# Patient Record
Sex: Female | Born: 1965 | Race: White | Hispanic: No | Marital: Married | Smoking: Never smoker
Health system: Southern US, Community
[De-identification: ages and names within clinical notes are randomized; demographics above are authoritative.]

## PROBLEM LIST (undated history)

## (undated) DIAGNOSIS — T4145XA Adverse effect of unspecified anesthetic, initial encounter: Secondary | ICD-10-CM

## (undated) DIAGNOSIS — Z8489 Family history of other specified conditions: Secondary | ICD-10-CM

## (undated) DIAGNOSIS — Z9889 Other specified postprocedural states: Secondary | ICD-10-CM

## (undated) DIAGNOSIS — K8012 Calculus of gallbladder with acute and chronic cholecystitis without obstruction: Secondary | ICD-10-CM

## (undated) DIAGNOSIS — M1990 Unspecified osteoarthritis, unspecified site: Secondary | ICD-10-CM

## (undated) DIAGNOSIS — K219 Gastro-esophageal reflux disease without esophagitis: Secondary | ICD-10-CM

## (undated) DIAGNOSIS — T8859XA Other complications of anesthesia, initial encounter: Secondary | ICD-10-CM

## (undated) DIAGNOSIS — R112 Nausea with vomiting, unspecified: Secondary | ICD-10-CM

## (undated) HISTORY — PX: ESOPHAGOGASTRODUODENOSCOPY: SHX1529

## (undated) HISTORY — PX: PATELLAR TENDON REPAIR: SHX737

## (undated) HISTORY — PX: ENDOMETRIAL ABLATION: SHX621

## (undated) HISTORY — PX: ABDOMINAL HYSTERECTOMY: SHX81

## (undated) HISTORY — PX: COLONOSCOPY: SHX174

## (undated) HISTORY — PX: TUBAL LIGATION: SHX77

## (undated) HISTORY — DX: Calculus of gallbladder with acute and chronic cholecystitis without obstruction: K80.12

## (undated) HISTORY — PX: ORIF PATELLA: SHX5033

---

## 2006-06-03 ENCOUNTER — Inpatient Hospital Stay: Payer: Self-pay | Admitting: Orthopaedic Surgery

## 2006-09-09 ENCOUNTER — Encounter: Payer: Self-pay | Admitting: Orthopaedic Surgery

## 2006-09-11 ENCOUNTER — Encounter: Payer: Self-pay | Admitting: Orthopaedic Surgery

## 2006-10-12 ENCOUNTER — Encounter: Payer: Self-pay | Admitting: Orthopaedic Surgery

## 2006-11-11 ENCOUNTER — Encounter: Payer: Self-pay | Admitting: Orthopaedic Surgery

## 2007-03-30 ENCOUNTER — Ambulatory Visit: Payer: Self-pay | Admitting: Obstetrics and Gynecology

## 2007-08-30 ENCOUNTER — Ambulatory Visit: Payer: Self-pay | Admitting: Internal Medicine

## 2009-03-16 ENCOUNTER — Ambulatory Visit: Payer: Self-pay | Admitting: Obstetrics and Gynecology

## 2009-03-28 ENCOUNTER — Ambulatory Visit: Payer: Self-pay | Admitting: Obstetrics and Gynecology

## 2009-04-25 ENCOUNTER — Ambulatory Visit: Payer: Self-pay | Admitting: Surgery

## 2009-04-25 HISTORY — PX: BREAST CYST ASPIRATION: SHX578

## 2009-09-26 ENCOUNTER — Ambulatory Visit: Payer: Self-pay | Admitting: Surgery

## 2010-03-20 ENCOUNTER — Ambulatory Visit: Payer: Self-pay | Admitting: Obstetrics and Gynecology

## 2011-03-26 ENCOUNTER — Ambulatory Visit: Payer: Self-pay | Admitting: Obstetrics and Gynecology

## 2011-04-08 ENCOUNTER — Ambulatory Visit: Payer: Self-pay | Admitting: Obstetrics and Gynecology

## 2012-04-06 ENCOUNTER — Ambulatory Visit: Payer: Self-pay | Admitting: Obstetrics and Gynecology

## 2013-01-27 ENCOUNTER — Ambulatory Visit: Payer: Self-pay | Admitting: Family Medicine

## 2013-01-29 ENCOUNTER — Ambulatory Visit: Payer: Self-pay | Admitting: Internal Medicine

## 2013-04-13 ENCOUNTER — Ambulatory Visit: Payer: Self-pay | Admitting: Obstetrics and Gynecology

## 2013-06-21 ENCOUNTER — Ambulatory Visit: Payer: Self-pay | Admitting: Gastroenterology

## 2013-06-23 LAB — PATHOLOGY REPORT

## 2014-06-01 ENCOUNTER — Ambulatory Visit: Payer: Self-pay | Admitting: Obstetrics and Gynecology

## 2014-11-30 ENCOUNTER — Encounter: Payer: Self-pay | Admitting: *Deleted

## 2014-11-30 ENCOUNTER — Inpatient Hospital Stay: Admission: RE | Admit: 2014-11-30 | Payer: Self-pay | Source: Ambulatory Visit

## 2014-11-30 NOTE — Patient Instructions (Signed)
  Your procedure is scheduled on: 12-08-14  Report to [place] To find out your arrival time please call [phone] between 1PM - 3PM on 12-07-14  Remember: Instructions that are not followed completely may result in serious medical risk, up to and including death, or upon the discretion of your surgeon and anesthesiologist your surgery may need to be rescheduled.    __X__ 1. Do not eat food or drink liquids after midnight. No gum chewing or hard candies.     __X__ 2. No Alcohol for 24 hours before or after surgery.   ____ 3. Bring all medications with you on the day of surgery if instructed.    ____ 4. Notify your doctor if there is any change in your medical condition     (cold, fever, infections).     Do not wear jewelry, make-up, hairpins, clips or nail polish.  Do not wear lotions, powders, or perfumes. You may wear deodorant.  Do not shave 48 hours prior to surgery. Men may shave face and neck.  Do not bring valuables to the hospital.    [facility] is not responsible for any belongings or valuables.               Contacts, dentures or bridgework may not be worn into surgery.  Leave your suitcase in the car. After surgery it may be brought to your room.  For patients admitted to the hospital, discharge time is determined by your treatment team.   Patients discharged the day of surgery will not be allowed to drive home.   Please read over the following fact sheets that you were given:      _X___ Take these medicines the morning of surgery with A SIP OF WATER:    1. PRILOSEC  2. TAKE AN EXTRA DOSE OF PRILOSEC Wednesday NIGHT  3.   4.  5.  6.  ____ Fleet Enema (as directed)   ____ Use CHG Soap as directed  ____ Use inhalers on the day of surgery  ____ Stop metformin 2 days prior to surgery    ____ Take 1/2 of usual insulin dose the night before surgery and none on the morning of surgery.   ____ Stop Coumadin/Plavix/aspirin  -N/A  ____ Stop Anti-inflammatories-NO NSAIDS OR ASA PRODUCTS-TYLENOL OK   ____ Stop supplements until after surgery.    ____ Bring C-Pap to the hospital.

## 2014-12-08 ENCOUNTER — Encounter
Admission: RE | Disposition: A | Discharge status: Home / Self Care | Payer: Self-pay | Source: Ambulatory Visit | Attending: Surgery

## 2014-12-08 ENCOUNTER — Encounter: Payer: Self-pay | Admitting: *Deleted

## 2014-12-08 ENCOUNTER — Ambulatory Visit: Payer: BLUE CROSS/BLUE SHIELD | Admitting: Anesthesiology

## 2014-12-08 ENCOUNTER — Ambulatory Visit
Admission: RE | Admit: 2014-12-08 | Discharge: 2014-12-08 | Disposition: A | Discharge status: Home / Self Care | Payer: BLUE CROSS/BLUE SHIELD | Source: Ambulatory Visit | Attending: Surgery | Admitting: Surgery

## 2014-12-08 DIAGNOSIS — K219 Gastro-esophageal reflux disease without esophagitis: Secondary | ICD-10-CM | POA: Diagnosis not present

## 2014-12-08 DIAGNOSIS — K648 Other hemorrhoids: Secondary | ICD-10-CM | POA: Insufficient documentation

## 2014-12-08 DIAGNOSIS — K644 Residual hemorrhoidal skin tags: Secondary | ICD-10-CM | POA: Insufficient documentation

## 2014-12-08 DIAGNOSIS — Z880 Allergy status to penicillin: Secondary | ICD-10-CM | POA: Diagnosis not present

## 2014-12-08 HISTORY — DX: Family history of other specified conditions: Z84.89

## 2014-12-08 HISTORY — DX: Gastro-esophageal reflux disease without esophagitis: K21.9

## 2014-12-08 HISTORY — PX: HEMORRHOID SURGERY: SHX153

## 2014-12-08 LAB — POCT PREGNANCY, URINE: PREG TEST UR: NEGATIVE

## 2014-12-08 SURGERY — HEMORRHOIDECTOMY
Anesthesia: General

## 2014-12-08 MED ORDER — FENTANYL CITRATE (PF) 100 MCG/2ML IJ SOLN
INTRAMUSCULAR | Status: DC | PRN
  Administered 2014-12-08: 100 ug via INTRAVENOUS

## 2014-12-08 MED ORDER — FENTANYL CITRATE (PF) 100 MCG/2ML IJ SOLN
INTRAMUSCULAR | Status: AC
  Filled 2014-12-08: qty 2

## 2014-12-08 MED ORDER — ONDANSETRON HCL 4 MG/2ML IJ SOLN
4.0000 mg | Freq: Once | INTRAMUSCULAR | Status: AC | PRN
  Administered 2014-12-08: 4 mg via INTRAVENOUS

## 2014-12-08 MED ORDER — OXYCODONE-ACETAMINOPHEN 5-325 MG PO TABS: 1.0000 | ORAL_TABLET | ORAL | Status: DC | PRN

## 2014-12-08 MED ORDER — GLYCOPYRROLATE 0.2 MG/ML IJ SOLN
INTRAMUSCULAR | Status: DC | PRN
  Administered 2014-12-08: 0.2 mg via INTRAVENOUS

## 2014-12-08 MED ORDER — BUPIVACAINE-EPINEPHRINE (PF) 0.5% -1:200000 IJ SOLN
INTRAMUSCULAR | Status: AC
  Filled 2014-12-08: qty 30

## 2014-12-08 MED ORDER — BUPIVACAINE LIPOSOME 1.3 % IJ SUSP
INTRAMUSCULAR | Status: AC
  Filled 2014-12-08: qty 20

## 2014-12-08 MED ORDER — METOPROLOL TARTRATE 1 MG/ML IV SOLN
INTRAVENOUS | Status: DC | PRN
  Administered 2014-12-08 (×2): 1 mg via INTRAVENOUS
  Administered 2014-12-08: 3 mg via INTRAVENOUS

## 2014-12-08 MED ORDER — PROMETHAZINE HCL 25 MG/ML IJ SOLN
INTRAMUSCULAR | Status: AC
  Filled 2014-12-08: qty 1

## 2014-12-08 MED ORDER — FENTANYL CITRATE (PF) 100 MCG/2ML IJ SOLN
25.0000 ug | INTRAMUSCULAR | Status: DC | PRN
  Administered 2014-12-08 (×4): 25 ug via INTRAVENOUS

## 2014-12-08 MED ORDER — MIDAZOLAM HCL 2 MG/2ML IJ SOLN
INTRAMUSCULAR | Status: DC | PRN
  Administered 2014-12-08: 2 mg via INTRAVENOUS

## 2014-12-08 MED ORDER — BUPIVACAINE LIPOSOME 1.3 % IJ SUSP
INTRAMUSCULAR | Status: DC | PRN
  Administered 2014-12-08: 20 mL

## 2014-12-08 MED ORDER — PROPOFOL 10 MG/ML IV BOLUS
INTRAVENOUS | Status: DC | PRN
  Administered 2014-12-08: 200 mg via INTRAVENOUS

## 2014-12-08 MED ORDER — LACTATED RINGERS IV SOLN
INTRAVENOUS | Status: DC
  Administered 2014-12-08: 12:00:00 via INTRAVENOUS

## 2014-12-08 MED ORDER — BUPIVACAINE HCL (PF) 0.5 % IJ SOLN
INTRAMUSCULAR | Status: AC
  Filled 2014-12-08: qty 30

## 2014-12-08 SURGICAL SUPPLY — 30 items
BLADE SURG 15 STRL LF DISP TIS (BLADE) ×1 IMPLANT
BLADE SURG 15 STRL SS (BLADE) ×2
CANISTER SUCT 1200ML W/VALVE (MISCELLANEOUS) ×3 IMPLANT
DRAPE LAPAROTOMY 100X77 ABD (DRAPES) ×3 IMPLANT
DRAPE LEGGINS SURG 28X43 STRL (DRAPES) ×3 IMPLANT
DRAPE UNDER BUTTOCK W/FLU (DRAPES) ×3 IMPLANT
GAUZE SPONGE 4X4 12PLY STRL (GAUZE/BANDAGES/DRESSINGS) ×3 IMPLANT
GLOVE BIO SURGEON STRL SZ7.5 (GLOVE) ×15 IMPLANT
GOWN STRL REUS W/ TWL LRG LVL3 (GOWN DISPOSABLE) ×3 IMPLANT
GOWN STRL REUS W/TWL LRG LVL3 (GOWN DISPOSABLE) ×6
HARMONIC SCALPEL FOCUS (MISCELLANEOUS) IMPLANT
JELLY LUB 2OZ STRL (MISCELLANEOUS) ×2
JELLY LUBE 2OZ STRL (MISCELLANEOUS) ×1 IMPLANT
LABEL OR SOLS (LABEL) ×3 IMPLANT
NDL SAFETY 25GX1.5 (NEEDLE) ×3 IMPLANT
NS IRRIG 500ML POUR BTL (IV SOLUTION) ×3 IMPLANT
PACK BASIN MINOR ARMC (MISCELLANEOUS) ×3 IMPLANT
PAD ABD DERMACEA PRESS 5X9 (GAUZE/BANDAGES/DRESSINGS) IMPLANT
PAD GROUND ADULT SPLIT (MISCELLANEOUS) ×3 IMPLANT
PAD PREP 24X41 OB/GYN DISP (PERSONAL CARE ITEMS) ×3 IMPLANT
SOL PREP PVP 2OZ (MISCELLANEOUS) ×3
SOLUTION PREP PVP 2OZ (MISCELLANEOUS) ×1 IMPLANT
STAPLER PROXIMATE HCS (STAPLE) IMPLANT
STRAP SAFETY BODY (MISCELLANEOUS) ×3 IMPLANT
SUT CHROMIC 0 SH (SUTURE) ×3 IMPLANT
SUT CHROMIC 2 0 SH (SUTURE) ×3 IMPLANT
SUT CHROMIC 3 0 SH 27 (SUTURE) ×3 IMPLANT
SUT CHROMIC 4 0 RB 1X27 (SUTURE) ×3 IMPLANT
SUT PROLENE 3 0 PS 2 (SUTURE) IMPLANT
SYRINGE 10CC LL (SYRINGE) ×3 IMPLANT

## 2014-12-08 NOTE — Transfer of Care (Signed)
Immediate Anesthesia Transfer of Care Note  Patient: Monique Brock  Procedure(s) Performed: Procedure(s): HEMORRHOIDECTOMY (N/A)  Patient Location: PACU  Anesthesia Type:General  Level of Consciousness: awake  Airway & Oxygen Therapy: Patient Spontanous Breathing  Post-op Assessment: Report given to RN  Post vital signs: stable  Last Vitals:  Filed Vitals:   12/08/14 1204  BP: 139/83  Pulse: 70  Temp: 37 C  Resp: 14    Complications: No apparent anesthesia complications
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
## 2014-12-08 NOTE — Anesthesia Postprocedure Evaluation (Signed)
  Anesthesia Post-op Note  Patient: Monique Brock  Procedure(s) Performed: Procedure(s): HEMORRHOIDECTOMY (N/A)  Anesthesia type:General  Patient location: PACU  Post pain: Pain level controlled  Post assessment: Post-op Vital signs reviewed, Patient's Cardiovascular Status Stable, Respiratory Function Stable, Patent Airway and No signs of Nausea or vomiting  Post vital signs: Reviewed and stable  Last Vitals:  Filed Vitals:   12/08/14 1558  BP: 112/70  Pulse: 77  Temp:   Resp: 16    Level of consciousness: awake, alert  and patient cooperative  Complications: No apparent anesthesia complications

## 2014-12-08 NOTE — Op Note (Signed)
OPERATIVE REPORT  PREOPERATIVE  DIAGNOSIS: Hemorrhoids.  POSTOPERATIVE DIAGNOSIS: . Hemorrhoids  PROCEDURE: . Hemorrhoidectomy  ANESTHESIA:  General  SURGEON: Rochel Brome  MD   INDICATIONS: . She reports a history of pain and bleeding. She had physical exam and anoscopic findings of internal and external hemorrhoids. Surgery was recommended for definitive treatment  With the patient on the operating table in the supine position she was placed under general anesthesia. The legs were elevated into the lithotomy position using ankle straps. The anal area was prepared with Betadine solution and draped with sterile towels and sheets. Initial inspection revealed that there was an external hemorrhoid which was posterior and also an external hemorrhoid which extended anterior from 10:00 to 12:00 position. Skin and subcutaneous tissues were infiltrated with Exparel and also  infiltrated around the sphincter The anal canal was dilated large enough to admit 2 fingers. The bivalve anal retractor was introduced. There was a large internal hemorrhoid which is anterior and also a large internal hemorrhoid which is at the 3:00 position. The hemorrhoid which was anterior was grasped with Kelly clamp and elevated a 0 chromic suture ligature was placed in the hemorrhoid was excised. There was some bleeding subsequently and used a 0 chromic suture ligature for hemostasis. A hemorrhoid at the 3:00 position was excised placing a 3-0 chromic suture ligature at the upper end of the internal hemorrhoid.  The hemorrhoid was excised using electrocautery for hemostasis the wound was repaired with running 3-0 chromic locked stitches.  The external hemorrhoid which extended from 10:00 to 12:00 was excised with a elliptical excision extending from 10:00 to 12:00 the wound was inspected and numerous small bleeding points are cauterized the wound was repaired with interrupted 4-0 chromic simple sutures leaving an opening for  drainage. Another external hemorrhoid at the 6:00 position was excised with a V-shaped incision. The wound was inspected and several small bleeding points were cauterized. The wound was repaired with interrupted 4-0 chromic sutures. Specimens were submitted for routine pathology. The wound was further prepared with Betadine solution and infiltrated subcutaneous tissues with Exparel and also infiltrated tissues surrounding the sphincter dressings were applied with paper tape  The patient appear to be in satisfactory condition and was prepared for transfer to the recovery room  [place].D.

## 2014-12-08 NOTE — Anesthesia Procedure Notes (Signed)
Procedure Name: LMA Insertion Date/Time: 12/08/2014 1:03 PM Performed by: Leander Rams Pre-anesthesia Checklist: Patient identified, Emergency Drugs available, Suction available, Patient being monitored and Timeout performed Patient Re-evaluated:Patient Re-evaluated prior to inductionOxygen Delivery Method: Circle system utilized Preoxygenation: Pre-oxygenation with 100% oxygen Intubation Type: IV induction LMA: LMA inserted LMA Size: 4.0

## 2014-12-08 NOTE — Progress Notes (Signed)
Neg preg test

## 2014-12-08 NOTE — H&P (Signed)
  She reports no change in condition since office visit.   Reviewed records and discussed plan for surgery

## 2014-12-08 NOTE — Discharge Instructions (Signed)
Take Tylenol or Percocet if needed for pain. Remove dressing tomorrow. May shower. May sitting in warm water. Tuck gauze or pad in underwear as needed for drainage. Continue MiraLAX 1 dose each day.AMBULATORY SURGERY  DISCHARGE INSTRUCTIONS   1) The drugs that you were given will stay in your system until tomorrow so for the next 24 hours you should not:  A) Drive an automobile B) Make any legal decisions C) Drink any alcoholic beverage   2) You may resume regular meals tomorrow.  Today it is better to start with liquids and gradually work up to solid foods.  You may eat anything you prefer, but it is better to start with liquids, then soup and crackers, and gradually work up to solid foods.   3) Please notify your doctor immediately if you have any unusual bleeding, trouble breathing, redness and pain at the surgery site, drainage, fever, or pain not relieved by medication.    4) Additional Instructions:        Please contact your physician with any problems or Same Day Surgery at [phone], Monday through Friday 6 am to 4 pm, or [place] at [facility] number at [phone].

## 2014-12-09 ENCOUNTER — Encounter: Payer: Self-pay | Admitting: Surgery

## 2014-12-12 LAB — SURGICAL PATHOLOGY

## 2015-05-29 ENCOUNTER — Other Ambulatory Visit: Payer: Self-pay | Admitting: Obstetrics and Gynecology

## 2015-05-29 DIAGNOSIS — Z1231 Encounter for screening mammogram for malignant neoplasm of breast: Secondary | ICD-10-CM

## 2015-06-09 ENCOUNTER — Ambulatory Visit: Payer: BLUE CROSS/BLUE SHIELD | Attending: Obstetrics and Gynecology

## 2016-06-03 ENCOUNTER — Emergency Department: Payer: Worker's Compensation

## 2016-06-03 ENCOUNTER — Encounter: Payer: Self-pay | Admitting: Emergency Medicine

## 2016-06-03 ENCOUNTER — Emergency Department
Admission: EM | Admit: 2016-06-03 | Discharge: 2016-06-03 | Disposition: A | Discharge status: Home / Self Care | Payer: Worker's Compensation | Attending: Emergency Medicine | Admitting: Emergency Medicine

## 2016-06-03 DIAGNOSIS — Y9389 Activity, other specified: Secondary | ICD-10-CM | POA: Diagnosis not present

## 2016-06-03 DIAGNOSIS — Y929 Unspecified place or not applicable: Secondary | ICD-10-CM | POA: Diagnosis not present

## 2016-06-03 DIAGNOSIS — W010XXA Fall on same level from slipping, tripping and stumbling without subsequent striking against object, initial encounter: Secondary | ICD-10-CM | POA: Diagnosis not present

## 2016-06-03 DIAGNOSIS — Y999 Unspecified external cause status: Secondary | ICD-10-CM | POA: Diagnosis not present

## 2016-06-03 DIAGNOSIS — S8391XA Sprain of unspecified site of right knee, initial encounter: Secondary | ICD-10-CM | POA: Insufficient documentation

## 2016-06-03 DIAGNOSIS — S8991XA Unspecified injury of right lower leg, initial encounter: Secondary | ICD-10-CM | POA: Diagnosis present

## 2016-06-03 MED ORDER — MELOXICAM 7.5 MG PO TABS: 7.5000 mg | ORAL_TABLET | Freq: Every day | ORAL | 0 refills | Status: DC

## 2016-06-03 MED ORDER — LIDOCAINE 5 % EX PTCH
1.0000 | MEDICATED_PATCH | CUTANEOUS | Status: DC
  Administered 2016-06-03: 1 via TRANSDERMAL
  Filled 2016-06-03: qty 1

## 2016-06-03 MED ORDER — LIDOCAINE 5 % EX PTCH: 1.0000 | MEDICATED_PATCH | CUTANEOUS | 0 refills | Status: DC

## 2016-06-03 NOTE — ED Notes (Signed)
R knee immobilizer fitted and applied, fitted for crutches and gait training done.

## 2016-06-03 NOTE — ED Notes (Signed)
See triage note  States she slipped on ice this am going to work  Having pain to right knee and lower leg  No deformity noted  Positive pulses

## 2016-06-03 NOTE — ED Triage Notes (Signed)
Pt brought to ed via ems with reports of right knee pain after falling on the ice.

## 2016-06-03 NOTE — ED Notes (Signed)
Drug screen done and walked to lab.
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
## 2016-06-11 ENCOUNTER — Ambulatory Visit
Admission: RE | Admit: 2016-06-11 | Discharge: 2016-06-11 | Disposition: A | Discharge status: Home / Self Care | Payer: 59 | Source: Ambulatory Visit | Attending: Obstetrics and Gynecology | Admitting: Obstetrics and Gynecology

## 2016-06-11 DIAGNOSIS — Z1231 Encounter for screening mammogram for malignant neoplasm of breast: Secondary | ICD-10-CM | POA: Insufficient documentation

## 2017-03-05 ENCOUNTER — Other Ambulatory Visit: Payer: Self-pay

## 2017-03-10 NOTE — H&P (Signed)
[name] is a 51 y.o. female here for AUB. Pt scheduled for [facility] and bilateral salpingectomy  .pt with a h/o AUB . 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 She is s/p an ablation and BTL in 2015  SVDx1 + LTCS x1.  No dyspareunia   Past Medical History:  has a past medical history of History of fracture.  Past Surgical History:  has a past surgical history that includes Colonoscopy (06/21/13); egd (06/21/13); Cesarean section; Endometrial ablation; Tubal ligation; RIGHT PATELLAR ORIF (06/04/2006); LEFT PATELLAR TENDON REPAIR (06/04/2006); and Hemorroidectomy (12/08/14). Family History: family history includes Gallbladder disease in her mother and other; High blood pressure (Hypertension) in her father and mother. Social History:  reports that she has never smoked. She has never used smokeless tobacco. She reports that she does not drink alcohol or use drugs. OB/GYN History:          OB History    Gravida Para Term Preterm AB Living   2 2 2     2    SAB TAB Ectopic Molar Multiple Live Births             2      Allergies: is allergic to amoxicillin. Medications:  Current Outpatient Prescriptions:  .  omeprazole (PRILOSEC) 20 MG DR capsule, TAKE ONE CAPSULE BY MOUTH EVERY DAY 1 HOUR BEFORE MEALS, Disp: 30 capsule, Rfl: 3  Review of Systems: General:                      No fatigue or weight loss Eyes:                           No vision changes Ears:                            No hearing difficulty Respiratory:                No cough or shortness of breath Pulmonary:                  No asthma or shortness of breath Cardiovascular:           No chest pain, palpitations, dyspnea on exertion Gastrointestinal:          No abdominal bloating, chronic diarrhea, constipations, masses, pain or hematochezia Genitourinary:             No hematuria, dysuria, abnormal vaginal discharge, pelvic pain, Menometrorrhagia Lymphatic:                    No swollen lymph nodes Musculoskeletal:         No muscle weakness Neurologic:                  No extremity weakness, syncope, seizure disorder Psychiatric:                  No history of depression, delusions or suicidal/homicidal ideation    Exam:      Vitals:   03/11/2017  1458  BP: (!) 127/93  Pulse: 89    Body mass index is 31.18 kg/m.  WDWN white/ female in NAD   Lungs: CTA  CV : RRR without murmur   Breast: exam done in sitting and lying position : No dimpling or retraction, no dominant mass, no spontaneous discharge,  no axillary adenopathy Neck:  no thyromegaly Abdomen: soft , no mass, normal active bowel sounds,  non-tender, no rebound tenderness Pelvic: tanner stage 5 ,  External genitalia: vulva /labia no lesions Urethra: no prolapse Vagina: normal physiologic d/c, adequate room for TVH if need be  Cervix: no lesions, no cervical motion tenderness   Uterus: normal size shape and contour, non-tender Adnexa: no mass,  non-tender   Rectovaginal: no mass heme negative  u/s : complex left ovarian cyst 1.7 cm , o/w wnl    Impression:   The primary encounter diagnosis was Abnormal uterine bleeding (AUB), unspecified. A diagnosis of Screening for cervical cancer was also pertinent to this visit.    Plan:    I have spoken with the patient regarding treatment options including expectant management, hormonal options, or surgical intervention. After a full discussion pt will proceed with [facility] and bilateral salpingectomy        Risks of the procedure discussed with the pt .  She is aware of the potential for upstaging an occult cancer with morcellator use    Talyssa Gibas Samuel [place], MD

## 2017-03-11 ENCOUNTER — Inpatient Hospital Stay: Admission: RE | Admit: 2017-03-11 | Payer: Self-pay | Source: Ambulatory Visit

## 2017-03-14 ENCOUNTER — Encounter
Admission: RE | Admit: 2017-03-14 | Discharge: 2017-03-14 | Disposition: A | Discharge status: Home / Self Care | Payer: BLUE CROSS/BLUE SHIELD | Source: Ambulatory Visit | Attending: Obstetrics and Gynecology | Admitting: Obstetrics and Gynecology

## 2017-03-14 HISTORY — DX: Unspecified osteoarthritis, unspecified site: M19.90

## 2017-03-14 NOTE — Patient Instructions (Addendum)
Your procedure is scheduled on: 03-21-17 FRIDAY Report to Same Day Surgery 2nd floor medical mall [facility] Entrance-take elevator on left to 2nd floor.  Check in with surgery information desk.) To find out your arrival time please call [phone] between 1PM - 3PM on 03-20-17 THURSDAY  Remember: Instructions that are not followed completely may result in serious medical risk, up to and including death, or upon the discretion of your surgeon and anesthesiologist your surgery may need to be rescheduled.    _x___ 1. Do not eat food after midnight the night before your procedure. NO GUM CHEWING OR CANDY AFTER MIDNIGHT.  You may drink clear liquids up to 2 hours before you are scheduled to arrive at the hospital for your procedure.  Do not drink clear liquids within 2 hours of your scheduled arrival to the hospital.  Clear liquids include  --Water or Apple juice without pulp  --Clear carbohydrate beverage such as ClearFast or Gatorade  --Black Coffee or Clear Tea (No milk, no creamers, do not add anything to  the coffee or Tea)      __x__ 2. No Alcohol for 24 hours before or after surgery.   __x__3. No Smoking for 24 prior to surgery.   ____  4. Bring all medications with you on the day of surgery if instructed.    __x__ 5. Notify your doctor if there is any change in your medical condition     (cold, fever, infections).     Do not wear jewelry, make-up, hairpins, clips or nail polish.  Do not wear lotions, powders, or perfumes. You may wear deodorant.  Do not shave 48 hours prior to surgery. Men may shave face and neck.  Do not bring valuables to the hospital.    [facility] is not responsible for any belongings or valuables.               Contacts, dentures or bridgework may not be worn into surgery.  Leave your suitcase in the car. After surgery it may be brought to your room.  For patients admitted to the hospital, discharge time is determined by your treatment  team.   Patients discharged the day of surgery will not be allowed to drive home.  You will need someone to drive you home and stay with you the night of your procedure.    Please read over the following fact sheets that you were given:   [facility] Preparing for Surgery and or MRSA Information   ____ TAKE THE FOLLOWING MEDICATION THE MORNING OF SURGERY WITH A SMALL SIP OF WATER. These include:  1. PRILOSEC  2. TAKE AN EXTRA PRILOSEC ON Thursday NIGHT BEFORE BED  3.  4.  5.  6.  _X___Fleets enema or Magnesium Citrate as directed-DO FLEETS ENEMA AT HOME 1 HOUR PRIOR TO [place]   _x___ Use CHG Soap or sage wipes as directed on instruction sheet   ____ Use inhalers on the day of surgery and bring to hospital day of surgery  ____ Stop Metformin and Janumet 2 days prior to surgery.    ____ Take 1/2 of usual insulin dose the night before surgery and none on the morning surgery.   ____ Follow recommendations from Cardiologist, Pulmonologist or PCP regarding stopping Aspirin, Coumadin, Plavix ,Eliquis, Effient, or Pradaxa, and Pletal.  X____Stop Anti-inflammatories such as Advil, ALEVE, Ibuprofen, Motrin, Naproxen, Naprosyn, Goodies powders or aspirin products NOW-OK to take Tylenol   ____ Stop supplements until after surgery.  ____ Bring C-Pap to the hospital.

## 2017-03-19 ENCOUNTER — Encounter
Admission: RE | Admit: 2017-03-19 | Discharge: 2017-03-19 | Disposition: A | Discharge status: Home / Self Care | Payer: BLUE CROSS/BLUE SHIELD | Source: Ambulatory Visit | Attending: Obstetrics and Gynecology | Admitting: Obstetrics and Gynecology

## 2017-03-19 DIAGNOSIS — K219 Gastro-esophageal reflux disease without esophagitis: Secondary | ICD-10-CM | POA: Diagnosis not present

## 2017-03-19 DIAGNOSIS — Z79899 Other long term (current) drug therapy: Secondary | ICD-10-CM | POA: Diagnosis not present

## 2017-03-19 DIAGNOSIS — N920 Excessive and frequent menstruation with regular cycle: Secondary | ICD-10-CM | POA: Diagnosis present

## 2017-03-19 DIAGNOSIS — N808 Other endometriosis: Secondary | ICD-10-CM | POA: Diagnosis not present

## 2017-03-19 DIAGNOSIS — Z880 Allergy status to penicillin: Secondary | ICD-10-CM | POA: Diagnosis not present

## 2017-03-19 DIAGNOSIS — Z6830 Body mass index (BMI) 30.0-30.9, adult: Secondary | ICD-10-CM | POA: Diagnosis not present

## 2017-03-19 DIAGNOSIS — E669 Obesity, unspecified: Secondary | ICD-10-CM | POA: Diagnosis not present

## 2017-03-19 LAB — BASIC METABOLIC PANEL
Anion gap: 8 (ref 5–15)
BUN: 9 mg/dL (ref 6–20)
CHLORIDE: 105 mmol/L (ref 101–111)
CO2: 25 mmol/L (ref 22–32)
CREATININE: 0.73 mg/dL (ref 0.44–1.00)
Calcium: 9.4 mg/dL (ref 8.9–10.3)
GFR calc non Af Amer: >60 mL/min (ref >60)
GLUCOSE: 94 mg/dL (ref 65–99)
Potassium: 3.8 mmol/L (ref 3.5–5.1)
Sodium: 138 mmol/L (ref 135–145)

## 2017-03-19 LAB — TYPE AND SCREEN
ABO/RH(D): A POS
Antibody Screen: NEGATIVE

## 2017-03-19 LAB — CBC
HCT: 38.9 % (ref 35.0–47.0)
HEMOGLOBIN: 13.4 g/dL (ref 12.0–16.0)
MCH: 30.7 pg (ref 26.0–34.0)
MCHC: 34.4 g/dL (ref 32.0–36.0)
MCV: 89.1 fL (ref 80.0–100.0)
PLATELETS: 233 10*3/uL (ref 150–440)
RBC: 4.36 MIL/uL (ref 3.80–5.20)
RDW: 13.5 % (ref 11.5–14.5)
WBC: 7.4 10*3/uL (ref 3.6–11.0)

## 2017-03-20 MED ORDER — CIPROFLOXACIN IN D5W 400 MG/200ML IV SOLN
400.0000 mg | INTRAVENOUS | Status: DC
  Filled 2017-03-20: qty 200

## 2017-03-20 MED ORDER — CLINDAMYCIN PHOSPHATE 900 MG/50ML IV SOLN: 900.0000 mg | INTRAVENOUS | Status: DC

## 2017-03-21 ENCOUNTER — Ambulatory Visit: Payer: BLUE CROSS/BLUE SHIELD | Admitting: Anesthesiology

## 2017-03-21 ENCOUNTER — Encounter: Payer: Self-pay | Admitting: *Deleted

## 2017-03-21 ENCOUNTER — Observation Stay
Admission: RE | Admit: 2017-03-21 | Discharge: 2017-03-21 | Disposition: A | Discharge status: Home / Self Care | Payer: BLUE CROSS/BLUE SHIELD | Source: Ambulatory Visit | Attending: Obstetrics and Gynecology | Admitting: Obstetrics and Gynecology

## 2017-03-21 ENCOUNTER — Encounter
Admission: RE | Disposition: A | Discharge status: Home / Self Care | Payer: Self-pay | Source: Ambulatory Visit | Attending: Obstetrics and Gynecology

## 2017-03-21 DIAGNOSIS — K219 Gastro-esophageal reflux disease without esophagitis: Secondary | ICD-10-CM | POA: Insufficient documentation

## 2017-03-21 DIAGNOSIS — N808 Other endometriosis: Secondary | ICD-10-CM | POA: Insufficient documentation

## 2017-03-21 DIAGNOSIS — N920 Excessive and frequent menstruation with regular cycle: Principal | ICD-10-CM | POA: Insufficient documentation

## 2017-03-21 DIAGNOSIS — Z880 Allergy status to penicillin: Secondary | ICD-10-CM | POA: Insufficient documentation

## 2017-03-21 DIAGNOSIS — Z79899 Other long term (current) drug therapy: Secondary | ICD-10-CM | POA: Insufficient documentation

## 2017-03-21 DIAGNOSIS — Z6830 Body mass index (BMI) 30.0-30.9, adult: Secondary | ICD-10-CM | POA: Insufficient documentation

## 2017-03-21 DIAGNOSIS — Z9889 Other specified postprocedural states: Secondary | ICD-10-CM

## 2017-03-21 DIAGNOSIS — E669 Obesity, unspecified: Secondary | ICD-10-CM | POA: Insufficient documentation

## 2017-03-21 HISTORY — PX: LAPAROSCOPIC BILATERAL SALPINGECTOMY: SHX5889

## 2017-03-21 HISTORY — PX: EXCISION OF ENDOMETRIOMA: SHX6473

## 2017-03-21 HISTORY — PX: LAPAROSCOPIC SUPRACERVICAL HYSTERECTOMY: SHX5399

## 2017-03-21 LAB — CBC
HCT: 37.2 % (ref 35.0–47.0)
HEMOGLOBIN: 12.7 g/dL (ref 12.0–16.0)
MCH: 30.6 pg (ref 26.0–34.0)
MCHC: 34.2 g/dL (ref 32.0–36.0)
MCV: 89.5 fL (ref 80.0–100.0)
Platelets: 263 10*3/uL (ref 150–440)
RBC: 4.16 MIL/uL (ref 3.80–5.20)
RDW: 13.7 % (ref 11.5–14.5)
WBC: 14.5 10*3/uL — AB (ref 3.6–11.0)

## 2017-03-21 LAB — ABO/RH: ABO/RH(D): A POS

## 2017-03-21 LAB — POCT PREGNANCY, URINE: PREG TEST UR: NEGATIVE

## 2017-03-21 SURGERY — HYSTERECTOMY, SUPRACERVICAL, LAPAROSCOPIC
Anesthesia: General

## 2017-03-21 MED ORDER — SILVER NITRATE-POT NITRATE 75-25 % EX MISC
CUTANEOUS | Status: DC | PRN
  Administered 2017-03-21: 5 via TOPICAL

## 2017-03-21 MED ORDER — ACETAMINOPHEN 10 MG/ML IV SOLN
INTRAVENOUS | Status: DC | PRN
  Administered 2017-03-21: 1000 mg via INTRAVENOUS

## 2017-03-21 MED ORDER — LACTATED RINGERS IV SOLN: INTRAVENOUS | Status: DC

## 2017-03-21 MED ORDER — CEFAZOLIN SODIUM-DEXTROSE 2-4 GM/100ML-% IV SOLN
INTRAVENOUS | Status: AC
  Filled 2017-03-21: qty 100

## 2017-03-21 MED ORDER — BUPIVACAINE HCL 0.5 % IJ SOLN
INTRAMUSCULAR | Status: DC | PRN
  Administered 2017-03-21: 18 mL

## 2017-03-21 MED ORDER — SUGAMMADEX SODIUM 200 MG/2ML IV SOLN
INTRAVENOUS | Status: AC
Start: 2017-03-21 — End: 2017-03-21
  Filled 2017-03-21: qty 2

## 2017-03-21 MED ORDER — PROPOFOL 10 MG/ML IV BOLUS
INTRAVENOUS | Status: DC | PRN
  Administered 2017-03-21: 160 mg via INTRAVENOUS

## 2017-03-21 MED ORDER — ACETAMINOPHEN NICU IV SYRINGE 10 MG/ML
INTRAVENOUS | Status: AC
  Filled 2017-03-21: qty 1

## 2017-03-21 MED ORDER — FENTANYL CITRATE (PF) 100 MCG/2ML IJ SOLN
INTRAMUSCULAR | Status: DC | PRN
  Administered 2017-03-21 (×2): 50 ug via INTRAVENOUS
  Administered 2017-03-21: 100 ug via INTRAVENOUS

## 2017-03-21 MED ORDER — ONDANSETRON HCL 4 MG/2ML IJ SOLN: 4.0000 mg | Freq: Four times a day (QID) | INTRAMUSCULAR | Status: DC | PRN

## 2017-03-21 MED ORDER — ONDANSETRON HCL 4 MG/2ML IJ SOLN
INTRAMUSCULAR | Status: AC
  Filled 2017-03-21: qty 2

## 2017-03-21 MED ORDER — SUGAMMADEX SODIUM 200 MG/2ML IV SOLN
INTRAVENOUS | Status: DC | PRN
  Administered 2017-03-21: 160 mg via INTRAVENOUS

## 2017-03-21 MED ORDER — FENTANYL CITRATE (PF) 100 MCG/2ML IJ SOLN
25.0000 ug | INTRAMUSCULAR | Status: DC | PRN
  Administered 2017-03-21 (×2): 25 ug via INTRAVENOUS

## 2017-03-21 MED ORDER — SILVER NITRATE-POT NITRATE 75-25 % EX MISC
CUTANEOUS | Status: AC
  Filled 2017-03-21: qty 1

## 2017-03-21 MED ORDER — PROPOFOL 10 MG/ML IV BOLUS
INTRAVENOUS | Status: AC
  Filled 2017-03-21: qty 20

## 2017-03-21 MED ORDER — OXYCODONE HCL 5 MG/5ML PO SOLN: 5.0000 mg | Freq: Once | ORAL | Status: DC | PRN

## 2017-03-21 MED ORDER — MEPERIDINE HCL 50 MG/ML IJ SOLN: 6.2500 mg | INTRAMUSCULAR | Status: DC | PRN

## 2017-03-21 MED ORDER — CLINDAMYCIN PHOSPHATE 900 MG/50ML IV SOLN
INTRAVENOUS | Status: AC
  Filled 2017-03-21: qty 50

## 2017-03-21 MED ORDER — FENTANYL CITRATE (PF) 100 MCG/2ML IJ SOLN
INTRAMUSCULAR | Status: AC
Start: 2017-03-21 — End: 2017-03-21
  Administered 2017-03-21: 25 ug via INTRAVENOUS
  Filled 2017-03-21: qty 2

## 2017-03-21 MED ORDER — PHENYLEPHRINE HCL 10 MG/ML IJ SOLN
INTRAMUSCULAR | Status: AC
  Filled 2017-03-21: qty 1

## 2017-03-21 MED ORDER — DEXAMETHASONE SODIUM PHOSPHATE 10 MG/ML IJ SOLN
INTRAMUSCULAR | Status: DC | PRN
  Administered 2017-03-21: 10 mg via INTRAVENOUS

## 2017-03-21 MED ORDER — KETOROLAC TROMETHAMINE 30 MG/ML IJ SOLN
INTRAMUSCULAR | Status: AC
  Filled 2017-03-21: qty 1

## 2017-03-21 MED ORDER — FENTANYL CITRATE (PF) 250 MCG/5ML IJ SOLN
INTRAMUSCULAR | Status: AC
  Filled 2017-03-21: qty 5

## 2017-03-21 MED ORDER — SODIUM CHLORIDE FLUSH 0.9 % IV SOLN
INTRAVENOUS | Status: AC
  Filled 2017-03-21: qty 10

## 2017-03-21 MED ORDER — KETOROLAC TROMETHAMINE 30 MG/ML IJ SOLN
INTRAMUSCULAR | Status: DC | PRN
  Administered 2017-03-21: 30 mg via INTRAVENOUS

## 2017-03-21 MED ORDER — DEXAMETHASONE SODIUM PHOSPHATE 10 MG/ML IJ SOLN
INTRAMUSCULAR | Status: AC
  Filled 2017-03-21: qty 1

## 2017-03-21 MED ORDER — PROMETHAZINE HCL 25 MG/ML IJ SOLN
6.2500 mg | INTRAMUSCULAR | Status: DC | PRN
  Administered 2017-03-21: 12.5 mg via INTRAVENOUS

## 2017-03-21 MED ORDER — LIDOCAINE HCL (PF) 2 % IJ SOLN
INTRAMUSCULAR | Status: AC
  Filled 2017-03-21: qty 10

## 2017-03-21 MED ORDER — ROCURONIUM BROMIDE 100 MG/10ML IV SOLN
INTRAVENOUS | Status: DC | PRN
  Administered 2017-03-21: 50 mg via INTRAVENOUS
  Administered 2017-03-21: 20 mg via INTRAVENOUS

## 2017-03-21 MED ORDER — ROCURONIUM BROMIDE 50 MG/5ML IV SOLN
INTRAVENOUS | Status: AC
  Filled 2017-03-21: qty 1

## 2017-03-21 MED ORDER — CIPROFLOXACIN IN D5W 400 MG/200ML IV SOLN
INTRAVENOUS | Status: AC
  Filled 2017-03-21: qty 200

## 2017-03-21 MED ORDER — OXYCODONE-ACETAMINOPHEN 5-325 MG PO TABS
1.0000 | ORAL_TABLET | ORAL | Status: DC | PRN
  Administered 2017-03-21: 1 via ORAL
  Filled 2017-03-21: qty 1

## 2017-03-21 MED ORDER — ONDANSETRON HCL 4 MG PO TABS: 4.0000 mg | ORAL_TABLET | Freq: Four times a day (QID) | ORAL | Status: DC | PRN

## 2017-03-21 MED ORDER — LACTATED RINGERS IV SOLN
INTRAVENOUS | Status: DC
  Administered 2017-03-21 (×2): via INTRAVENOUS

## 2017-03-21 MED ORDER — FLEET ENEMA 7-19 GM/118ML RE ENEM: 1.0000 | ENEMA | Freq: Once | RECTAL | Status: DC

## 2017-03-21 MED ORDER — SUGAMMADEX SODIUM 200 MG/2ML IV SOLN
INTRAVENOUS | Status: AC
  Filled 2017-03-21: qty 2

## 2017-03-21 MED ORDER — OXYCODONE HCL 5 MG PO TABS: 5.0000 mg | ORAL_TABLET | Freq: Once | ORAL | Status: DC | PRN

## 2017-03-21 MED ORDER — CEFAZOLIN SODIUM-DEXTROSE 2-4 GM/100ML-% IV SOLN
2.0000 g | Freq: Once | INTRAVENOUS | Status: AC
  Administered 2017-03-21: 2 g via INTRAVENOUS

## 2017-03-21 MED ORDER — LIDOCAINE HCL (CARDIAC) 20 MG/ML IV SOLN
INTRAVENOUS | Status: DC | PRN
  Administered 2017-03-21: 80 mg via INTRAVENOUS

## 2017-03-21 MED ORDER — MIDAZOLAM HCL 5 MG/5ML IJ SOLN
INTRAMUSCULAR | Status: AC
  Filled 2017-03-21: qty 5

## 2017-03-21 MED ORDER — PROMETHAZINE HCL 25 MG/ML IJ SOLN
INTRAMUSCULAR | Status: AC
  Administered 2017-03-21: 12.5 mg via INTRAVENOUS
  Filled 2017-03-21: qty 1

## 2017-03-21 MED ORDER — MORPHINE SULFATE (PF) 2 MG/ML IV SOLN: 1.0000 mg | INTRAVENOUS | Status: DC | PRN

## 2017-03-21 MED ORDER — ONDANSETRON HCL 4 MG/2ML IJ SOLN
INTRAMUSCULAR | Status: DC | PRN
  Administered 2017-03-21: 4 mg via INTRAVENOUS

## 2017-03-21 MED ORDER — MIDAZOLAM HCL 2 MG/2ML IJ SOLN
INTRAMUSCULAR | Status: DC | PRN
  Administered 2017-03-21: 2 mg via INTRAVENOUS

## 2017-03-21 MED ORDER — BUPIVACAINE HCL (PF) 0.5 % IJ SOLN
INTRAMUSCULAR | Status: AC
  Filled 2017-03-21: qty 30

## 2017-03-21 MED ORDER — MIDAZOLAM HCL 2 MG/2ML IJ SOLN
INTRAMUSCULAR | Status: AC
  Filled 2017-03-21: qty 2

## 2017-03-21 SURGICAL SUPPLY — 40 items
BAG URO DRAIN 2000ML W/SPOUT (MISCELLANEOUS) ×4 IMPLANT
BLADE SURG SZ11 CARB STEEL (BLADE) ×8 IMPLANT
CANISTER SUCT 1200ML W/VALVE (MISCELLANEOUS) ×4 IMPLANT
CATH FOLEY 2WAY  5CC 16FR (CATHETERS) ×1
CATH URTH 16FR FL 2W BLN LF (CATHETERS) ×3 IMPLANT
CHLORAPREP W/TINT 26ML (MISCELLANEOUS) ×4 IMPLANT
DRSG TEGADERM 2-3/8X2-3/4 SM (GAUZE/BANDAGES/DRESSINGS) ×12 IMPLANT
GAUZE SPONGE NON-WVN 2X2 STRL (MISCELLANEOUS) ×6 IMPLANT
GLOVE BIO SURGEON STRL SZ8 (GLOVE) ×12 IMPLANT
GOWN STRL REUS W/ TWL LRG LVL3 (GOWN DISPOSABLE) ×6 IMPLANT
GOWN STRL REUS W/ TWL XL LVL3 (GOWN DISPOSABLE) ×3 IMPLANT
GOWN STRL REUS W/TWL LRG LVL3 (GOWN DISPOSABLE) ×2
GOWN STRL REUS W/TWL XL LVL3 (GOWN DISPOSABLE) ×1
GRASPER SUT TROCAR 14GX15 (MISCELLANEOUS) ×4 IMPLANT
IRRIGATION STRYKERFLOW (MISCELLANEOUS) ×3 IMPLANT
IRRIGATOR STRYKERFLOW (MISCELLANEOUS) ×4
IV LACTATED RINGERS 1000ML (IV SOLUTION) ×4 IMPLANT
KIT RM TURNOVER CYSTO AR (KITS) ×4 IMPLANT
LABEL OR SOLS (LABEL) ×4 IMPLANT
MORCELLATOR XCISE  COR (MISCELLANEOUS)
MORCELLATOR XCISE COR (MISCELLANEOUS) IMPLANT
NS IRRIG 500ML POUR BTL (IV SOLUTION) ×4 IMPLANT
PACK GYN LAPAROSCOPIC (MISCELLANEOUS) ×4 IMPLANT
PAD OB MATERNITY 4.3X12.25 (PERSONAL CARE ITEMS) ×4 IMPLANT
PAD PREP 24X41 OB/GYN DISP (PERSONAL CARE ITEMS) ×4 IMPLANT
SET CYSTO W/LG BORE CLAMP LF (SET/KITS/TRAYS/PACK) ×4 IMPLANT
SHEARS HARMONIC ACE PLUS 36CM (ENDOMECHANICALS) ×4 IMPLANT
SOLUTION ELECTROLUBE (MISCELLANEOUS) ×4 IMPLANT
SPONGE VERSALON 2X2 STRL (MISCELLANEOUS) ×2
STRIP CLOSURE SKIN 1/2X4 (GAUZE/BANDAGES/DRESSINGS) ×4 IMPLANT
SUT VIC AB 0 CT1 36 (SUTURE) ×12 IMPLANT
SUT VIC AB 2-0 UR6 27 (SUTURE) IMPLANT
SUT VIC AB 4-0 FS2 27 (SUTURE) ×4 IMPLANT
SUT VIC AB 4-0 SH 27 (SUTURE) ×1
SUT VIC AB 4-0 SH 27XANBCTRL (SUTURE) ×3 IMPLANT
SYRINGE 10CC LL (SYRINGE) ×4 IMPLANT
TROCAR ENDO BLADELESS 11MM (ENDOMECHANICALS) ×4 IMPLANT
TROCAR XCEL NON-BLD 5MMX100MML (ENDOMECHANICALS) ×4 IMPLANT
TROCAR XCEL UNIV SLVE 11M 100M (ENDOMECHANICALS) ×4 IMPLANT
TUBING INSUFFLATOR HI FLOW (MISCELLANEOUS) ×4 IMPLANT

## 2017-03-21 NOTE — Anesthesia Post-op Follow-up Note (Signed)
Anesthesia QCDR form completed.        

## 2017-03-21 NOTE — Progress Notes (Signed)
Pt scheduled for [facility] and bilateral salpingectomy . NPO   all questions answered . Labs checked . Neg HCG

## 2017-03-21 NOTE — OR Nursing (Signed)
Will start clindamycin on call to OR, and Cipro to be given in OR per A Helton, RN

## 2017-03-21 NOTE — Anesthesia Preprocedure Evaluation (Signed)
Anesthesia Evaluation  Patient identified by MRN, date of birth, ID band Patient awake    Reviewed: Allergy & Precautions, NPO status , Patient's Chart, lab work & pertinent test results  History of Anesthesia Complications Negative for: history of anesthetic complications  Airway Mallampati: III  TM Distance: >3 FB Neck ROM: Full    Dental no notable dental hx.    Pulmonary neg pulmonary ROS, neg PE   breath sounds clear to auscultation- rhonchi (-) wheezing      Cardiovascular Exercise Tolerance: Good (-) hypertension(-) CAD and (-) Past MI  Rhythm:Regular Rate:Normal - Systolic murmurs and - Diastolic murmurs    Neuro/Psych negative neurological ROS  negative psych ROS   GI/Hepatic Neg liver ROS, GERD  ,  Endo/Other  negative endocrine ROSneg diabetes  Renal/GU negative Renal ROS     Musculoskeletal  (+) Arthritis ,   Abdominal (+) + obese,   Peds  Hematology   Anesthesia Other Findings Past Medical History: No date: Arthritis     Comment:  BIL KNEES No date: Family history of adverse reaction to anesthesia     Comment:  PTS DAD IS HARD TO WAKE UP No date: GERD (gastroesophageal reflux disease)   Reproductive/Obstetrics                             Anesthesia Physical Anesthesia Plan  ASA: II  Anesthesia Plan: General   Post-op Pain Management:    Induction: Intravenous  PONV Risk Score and Plan: 2 and Dexamethasone and Ondansetron  Airway Management Planned: Oral ETT  Additional Equipment:   Intra-op Plan:   Post-operative Plan: Extubation in OR  Informed Consent: I have reviewed the patients History and Physical, chart, labs and discussed the procedure including the risks, benefits and alternatives for the proposed anesthesia with the patient or authorized representative who has indicated his/her understanding and acceptance.   Dental advisory given  Plan  Discussed with: CRNA and Anesthesiologist  Anesthesia Plan Comments:         Anesthesia Quick Evaluation

## 2017-03-21 NOTE — Anesthesia Postprocedure Evaluation (Signed)
Anesthesia Post Note  Patient: Monique Brock  Procedure(s) Performed: LAPAROSCOPIC SUPRACERVICAL HYSTERECTOMY (N/A ) LAPAROSCOPIC BILATERAL SALPINGECTOMY (Bilateral ) EXCISION OF ENDOMETRIOSIS IMPLANTS  Patient location during evaluation: PACU Anesthesia Type: General Level of consciousness: awake and alert and oriented Pain management: pain level controlled Vital Signs Assessment: post-procedure vital signs reviewed and stable Respiratory status: spontaneous breathing, nonlabored ventilation and respiratory function stable Cardiovascular status: blood pressure returned to baseline and stable Postop Assessment: no signs of nausea or vomiting Anesthetic complications: no     Last Vitals:  Vitals:   03/21/17 1021 03/21/17 1100  BP: 120/75 120/60  Pulse: 81 76  Resp: 10 16  Temp: 36.4 C 36.5 C  SpO2: 95% 98%    Last Pain:  Vitals:   03/21/17 1100  TempSrc: Oral  PainSc:                  Monique Brock

## 2017-03-21 NOTE — Discharge Summary (Signed)
Physician Discharge Summary  Patient ID: Monique Brock MRN: [id number] DOB/AGE: 1966/01/20 51 y.o.  Admit date: 03/21/2017 Discharge date: 03/21/2017  Admission Diagnoses: menorrhagia   Discharge Diagnoses: menorrhagia , endometriosis Active Problems:   Postoperative state   Discharged Condition: good  Hospital Course: underwent an uncomplicated LSH and bilateral salpingectomy and peritoneal excisional biopsies for presumed endometriosis  Consults: None  Significant Diagnostic Studies: labs:  Results for orders placed or performed during the hospital encounter of 03/21/17 (from the past 24 hour(s))  Pregnancy, urine POC     Status: None   Collection Time: 03/21/17  6:10 AM  Result Value Ref Range   Preg Test, Ur NEGATIVE NEGATIVE  ABO/Rh     Status: None   Collection Time: 03/21/17  6:14 AM  Result Value Ref Range   ABO/RH(D) A POS   CBC     Status: Abnormal   Collection Time: 03/21/17  4:58 PM  Result Value Ref Range   WBC 14.5 (H) 3.6 - 11.0 K/uL   RBC 4.16 3.80 - 5.20 MIL/uL   Hemoglobin 12.7 12.0 - 16.0 g/dL   HCT 37.2 35.0 - 47.0 %   MCV 89.5 80.0 - 100.0 fL   MCH 30.6 26.0 - 34.0 pg   MCHC 34.2 32.0 - 36.0 g/dL   RDW 13.7 11.5 - 14.5 %   Platelets 263 150 - 440 K/uL    Treatments: surgery as above   Discharge Exam: Blood pressure 109/70, pulse 81, temperature 97.8 F (36.6 C), temperature source Axillary, resp. rate 18, height 5\' 3"  (1.6 m), weight 78.5 kg (173 lb), last menstrual period 03/13/2017, SpO2 98 %. General appearance: alert and cooperative Abd: incisions CDI  Disposition: 01-Home or Self Care  Discharge Instructions    Call MD for:  difficulty breathing, headache or visual disturbances   Complete by:  As directed    Call MD for:  extreme fatigue   Complete by:  As directed    Call MD for:  hives   Complete by:  As directed    Call MD for:  persistant dizziness or light-headedness   Complete by:  As directed    Call MD for:  persistant  nausea and vomiting   Complete by:  As directed    Call MD for:  redness, tenderness, or signs of infection (pain, swelling, redness, odor or green/yellow discharge around incision site)   Complete by:  As directed    Call MD for:  severe uncontrolled pain   Complete by:  As directed    Call MD for:  temperature >100.4   Complete by:  As directed    Diet - low sodium heart healthy   Complete by:  As directed    Increase activity slowly   Complete by:  As directed      Allergies as of 03/21/2017      Reactions   Amoxicillin Hives   ONLY IF GIVEN IN HIGH DOSES.  ANYTHING OVER 500 MG AT A TIME CAUSES HIVES      Medication List    STOP taking these medications   acetaminophen 325 MG tablet Commonly known as:  TYLENOL   lidocaine 5 % Commonly known as:  LIDODERM   meloxicam 7.5 MG tablet Commonly known as:  MOBIC     TAKE these medications   multivitamin with minerals Tabs tablet Take 1 tablet by mouth daily.   naproxen sodium 220 MG tablet Commonly known as:  ALEVE Take 220 mg by mouth as  needed.   omeprazole 20 MG capsule Commonly known as:  PRILOSEC Take 20 mg by mouth every morning.      Follow-up Information    Urian Martenson, Gwen Her, MD Follow up in 2 week(s).   Specialty:  Obstetrics and Gynecology Contact information: [address] [place] [address] [phone]           Signed: Gwen Her Bert Givans 03/21/2017, 7:41 PM

## 2017-03-21 NOTE — OR Nursing (Signed)
Lab tech in for abo/rh draw @ [phone]

## 2017-03-21 NOTE — Brief Op Note (Signed)
03/21/2017  9:24 AM  PATIENT:  Elie Confer  51 y.o. female  PRE-OPERATIVE DIAGNOSIS:  Menorrhagia  POST-OPERATIVE DIAGNOSIS:  Menorrhagia, endometriosis  PROCEDURE:  Procedure(s): LAPAROSCOPIC SUPRACERVICAL HYSTERECTOMY (N/A) LAPAROSCOPIC BILATERAL SALPINGECTOMY (Bilateral) EXCISION OF ENDOMETRIOSIS IMPLANTS  SURGEON:  Surgeon(s) and Role:    * Schermerhorn, Gwen Her, MD - Primary    * Benjaman Kindler, MD - Assisting  PHYSICIAN ASSISTANT: Lyda Kalata , [place] student  ASSISTANTS: none   ANESTHESIA:   general  EBL:  25 mL  IOF 750 cc , UO 300 cc  BLOOD ADMINISTERED:none  DRAINS: Urinary Catheter (Foley)   LOCAL MEDICATIONS USED:  Marcaine  SPECIMEN:  Source of Specimen:  uterus , bilateral fallopian tubes , excsion of endometriosisl implants   DISPOSITION OF SPECIMEN:  PATHOLOGY  COUNTS:  YES  TOURNIQUET:  * No tourniquets in log *  DICTATION: .Other Dictation: Dictation Number verbal  PLAN OF CARE: Admit for overnight observation  PATIENT DISPOSITION:  PACU - hemodynamically stable.   Delay start of Pharmacological VTE agent (>24hrs) due to surgical blood loss or risk of bleeding: not applicable

## 2017-03-21 NOTE — OR Nursing (Signed)
Dr. Ouida Sills changed Clindamycin to Ancef 2gm - notified Gustavo Lah, RN of same and that both will coming back to OR with patient to be administered there.

## 2017-03-21 NOTE — Progress Notes (Signed)
03/21/2017 8:28 PM  BP 109/70 (BP Location: Right Arm)   Pulse 81   Temp 97.8 F (36.6 C) (Axillary)   Resp 18   Ht 5\' 3"  (160 cm)   Wt 173 lb (78472 g)   LMP 03/13/2017   SpO2 98%   BMI 30.65 kg/m  Patient discharged per MD orders. Discharge instructions reviewed with patient and patient verbalized understanding. IV removed per policy. Prescriptions discussed and previously given to patient. Discharged via wheelchair escorted by nursing staff with family escorting.  Almedia Balls, RN

## 2017-03-21 NOTE — Transfer of Care (Signed)
Immediate Anesthesia Transfer of Care Note  Patient: Monique Brock  Procedure(s) Performed: LAPAROSCOPIC SUPRACERVICAL HYSTERECTOMY (N/A ) LAPAROSCOPIC BILATERAL SALPINGECTOMY (Bilateral ) EXCISION OF ENDOMETRIOSIS IMPLANTS  Patient Location: PACU  Anesthesia Type:General  Level of Consciousness: drowsy and patient cooperative  Airway & Oxygen Therapy: Patient Spontanous Breathing and Patient connected to face mask oxygen  Post-op Assessment: Report given to RN and Post -op Vital signs reviewed and stable  Post vital signs: Reviewed and stable  Last Vitals:  Vitals:   03/21/17 0604  BP: 132/85  Pulse: 85  Resp: 16  Temp: 36.7 C  SpO2: 99%    Last Pain:  Vitals:   03/21/17 0604  TempSrc: Tympanic         Complications: No apparent anesthesia complications

## 2017-03-21 NOTE — Anesthesia Procedure Notes (Addendum)
Procedure Name: Intubation Date/Time: 03/21/2017 7:25 AM Performed by: Silvana Newness, CRNA Pre-anesthesia Checklist: Patient identified, Emergency Drugs available, Suction available, Patient being monitored and Timeout performed Patient Re-evaluated:Patient Re-evaluated prior to induction Oxygen Delivery Method: Circle system utilized Preoxygenation: Pre-oxygenation with 100% oxygen Induction Type: IV induction Ventilation: Mask ventilation without difficulty Laryngoscope Size: Mac and 3 Grade View: Grade I Tube type: Oral Tube size: 7.0 mm Number of attempts: 1 Airway Equipment and Method: Stylet Placement Confirmation: ETT inserted through vocal cords under direct vision,  positive ETCO2 and breath sounds checked- equal and bilateral Secured at: 19 cm Tube secured with: Tape Dental Injury: Teeth and Oropharynx as per pre-operative assessment

## 2017-03-22 ENCOUNTER — Encounter: Payer: Self-pay | Admitting: Obstetrics and Gynecology

## 2017-03-24 LAB — SURGICAL PATHOLOGY

## 2017-03-24 NOTE — Op Note (Signed)
Monique Brock, [name]:  [id number]  MEDICAL RECORD NO.:  [id number]  LOCATION:                                 FACILITY:  PHYSICIAN:  Laverta Baltimore, MD     DATE OF BIRTH:  DATE OF PROCEDURE:  03/21/2017 DATE OF DISCHARGE:                              OPERATIVE REPORT   PREOPERATIVE DIAGNOSIS:  Menorrhagia, status post endometrial ablation.  POSTOPERATIVE DIAGNOSIS: 1. Menorrhagia. 2. Peritoneal scarring consistent with endometriosis.  PROCEDURE PERFORMED: 1. Laparoscopic supracervical hysterectomy. 2. Bilateral salpingectomy. 3. Excision of endometriosis implants.  SURGEON:  Laverta Baltimore, MD  FIRST ASSISTANT:  Leafy Ro.  SECOND ASSISTANT:  Lyda Kalata, [place] student.  ANESTHESIA:  General endotracheal anesthesia.  INDICATION:  This is a 51 year old, status post endometrial ablation 2 years previously with continued heavy menstrual cycles.  The patient also with dysmenorrhea.  DESCRIPTION OF PROCEDURE:  After adequate general endotracheal anesthesia, the patient was placed in dorsal supine position.  The patient was prepped and draped in normal sterile fashion.  Time-out was performed.  The patient did receive 2 g IV Ancef prior to commencement of the case.  A speculum was placed into the vagina and the anterior cervix was grasped with a single-tooth tenaculum and uterine sound was partially placed into the endocervical canal.  Given previous endometrial ablation, there was some scarring and the sound was not advanced past the cervix.  The sound and the single-tooth tenaculum were tethered together with Steri-Strips to be used for uterine manipulation during the procedure.  Foley catheter was placed to drain the bladder during the procedure.  Gloves were changed.  Attention was directed to the patient's abdomen.  Infraumbilical area was injected with 0.5% Marcaine and a 12 mm infraumbilical incision was made and the laparoscope was  advanced into the abdominal cavity under direct visualization with the Optiview cannula.  The patient's abdomen was insufflated with carbon dioxide.  Attention was directed to the patient's left lower quadrant.  A second port was placed 3 cm medial to the left anterior iliac spine under direct visualization.  A #11 mm trocar was advanced.  Third port was placed in the right lower quadrant again 3 cm medial to the right anterior iliac spine.  A 5 mm port was advanced under direct visualization.  Initial impression of the abdomen revealed slightly globular uterus.  The right ureter peristalsis was identified.  The left ureter peristalsis was not identified.  There was scarring in the deep posterior aspect of the cul-de-sac consistent with endometriosis.  There were 2 nodularity areas medial to the insertion of the uterosacral ligament to the lower cervix, and there was a powder burn area on the right uterosacral ligament consistent with endometriosis as well.  Harmonic scalpel was brought up to the operative field.  The distal portion of the left fallopian tube was elevated and the distal salpingectomy was performed with the Harmonic scalpel.  Of note, the patient had a previous tubal ligation.  The left round ligament was opened with Harmonic scalpel and the utero-ovarian ligament was transected.  The broad ligament was incised, followed by skeletonization of the left uterine artery.  Bladder flap was opened and the  left uterine artery was cauterized with the Kleppinger's and then transected with Harmonic scalpel.  Similar procedure was repeated on the patient's right side.  Again, excising the distal portion of the right fallopian tube, followed by opening the round ligament and the utero- ovarian ligament and skeletonization of the right uterine artery and then cauterization and transection.  The cervix was then incised at the level of the uterosacral ligaments and the uterus was then  freed.  Good hemostasis was noted.  Kleppinger cautery was used for small bleeding areas of the cervical bed and the endocervix was then cauterized with the Kleppinger.  Morcellator was then brought up to the operative field and the uterus was then morcellated in standard fashion.  The patient's abdomen was irrigated.  Cervical bed appeared hemostatic.  There was a simple left ovarian cyst, which was opened with Harmonic scalpel and 5 mL of clear fluid resulted.  The areas in question that appeared to be consistent with endometriosis were removed.  The left lower pelvic sidewall was tented with a grasper, and Harmonic scalpel was used to excise this area as well as the cervical nodules and the right uterosacral ligament powder burn area.  Three separate excisional biopsies were performed.  Good hemostasis was noted.  The patient's abdomen was copiously irrigated again with good hemostasis.  Pressure was lowered to 7 mmHg and again, good hemostasis was noted.  The infraumbilical fascia and the left lower port site fascia were closed with figure-of-eight Vicryl suture utilizing the Carter-Thomason cone. Good closure of the defect was noted.  The patient's abdomen was deflated from carbon dioxide and all skin incisions were closed with interrupted 4-0 Vicryl suture.  Single-tooth tenaculum was removed from the anterior cervix and silver nitrate was applied to the anterior cervix oozing from the tenaculum site.  There were no complications. The patient tolerated the procedure well.  ESTIMATED BLOOD LOSS:  25 mL.  INTRAOPERATIVE FLUIDS:  750 mL.  URINE OUTPUT:  300 mL.  The patient was taken to recovery room in good condition.    ______________________________ Laverta Baltimore, MD   ______________________________ Laverta Baltimore, MD    TS/MEDQ  D:  03/21/2017  T:  03/21/2017  Job:  [id number]

## 2017-05-30 ENCOUNTER — Other Ambulatory Visit: Payer: Self-pay

## 2017-05-30 ENCOUNTER — Encounter: Payer: Self-pay | Admitting: Emergency Medicine

## 2017-05-30 ENCOUNTER — Ambulatory Visit
Admission: EM | Admit: 2017-05-30 | Discharge: 2017-05-30 | Disposition: A | Discharge status: Other Acute Inpatient Hospital | Payer: BLUE CROSS/BLUE SHIELD | Attending: Family Medicine | Admitting: Family Medicine

## 2017-05-30 ENCOUNTER — Ambulatory Visit
Admission: RE | Admit: 2017-05-30 | Discharge: 2017-05-30 | Disposition: A | Discharge status: Home / Self Care | Payer: BLUE CROSS/BLUE SHIELD | Source: Ambulatory Visit | Attending: Family Medicine | Admitting: Family Medicine

## 2017-05-30 DIAGNOSIS — K8020 Calculus of gallbladder without cholecystitis without obstruction: Secondary | ICD-10-CM

## 2017-05-30 DIAGNOSIS — R748 Abnormal levels of other serum enzymes: Secondary | ICD-10-CM | POA: Diagnosis present

## 2017-05-30 DIAGNOSIS — R160 Hepatomegaly, not elsewhere classified: Secondary | ICD-10-CM | POA: Diagnosis not present

## 2017-05-30 DIAGNOSIS — R110 Nausea: Secondary | ICD-10-CM

## 2017-05-30 DIAGNOSIS — K7689 Other specified diseases of liver: Secondary | ICD-10-CM | POA: Diagnosis not present

## 2017-05-30 DIAGNOSIS — R1011 Right upper quadrant pain: Secondary | ICD-10-CM

## 2017-05-30 DIAGNOSIS — R109 Unspecified abdominal pain: Secondary | ICD-10-CM | POA: Diagnosis present

## 2017-05-30 DIAGNOSIS — R1010 Upper abdominal pain, unspecified: Secondary | ICD-10-CM | POA: Diagnosis not present

## 2017-05-30 LAB — CBC WITH DIFFERENTIAL/PLATELET
Basophils Absolute: 0 10*3/uL (ref 0–0.1)
Basophils Relative: 0 %
EOS ABS: 0 10*3/uL (ref 0–0.7)
EOS PCT: 0 %
HCT: 38.7 % (ref 35.0–47.0)
HEMOGLOBIN: 13.5 g/dL (ref 12.0–16.0)
LYMPHS ABS: 0.8 10*3/uL — AB (ref 1.0–3.6)
Lymphocytes Relative: 12 %
MCH: 30.6 pg (ref 26.0–34.0)
MCHC: 34.9 g/dL (ref 32.0–36.0)
MCV: 87.7 fL (ref 80.0–100.0)
MONOS PCT: 6 %
Monocytes Absolute: 0.4 10*3/uL (ref 0.2–0.9)
Neutro Abs: 5.6 10*3/uL (ref 1.4–6.5)
Neutrophils Relative %: 82 %
Platelets: 230 10*3/uL (ref 150–440)
RBC: 4.41 MIL/uL (ref 3.80–5.20)
RDW: 13.3 % (ref 11.5–14.5)
WBC: 6.8 10*3/uL (ref 3.6–11.0)

## 2017-05-30 LAB — COMPREHENSIVE METABOLIC PANEL
ALK PHOS: 73 U/L (ref 38–126)
ALT: 396 U/L — ABNORMAL HIGH (ref 14–54)
AST: 509 U/L — ABNORMAL HIGH (ref 15–41)
Albumin: 4.2 g/dL (ref 3.5–5.0)
Anion gap: 8 (ref 5–15)
BUN: 7 mg/dL (ref 6–20)
CALCIUM: 8.6 mg/dL — AB (ref 8.9–10.3)
CO2: 20 mmol/L — AB (ref 22–32)
Chloride: 105 mmol/L (ref 101–111)
Creatinine, Ser: 0.62 mg/dL (ref 0.44–1.00)
Glucose, Bld: 105 mg/dL — ABNORMAL HIGH (ref 65–99)
Potassium: 3.8 mmol/L (ref 3.5–5.1)
SODIUM: 133 mmol/L — AB (ref 135–145)
Total Bilirubin: 1.7 mg/dL — ABNORMAL HIGH (ref 0.3–1.2)
Total Protein: 7.7 g/dL (ref 6.5–8.1)

## 2017-05-30 LAB — URINALYSIS, COMPLETE (UACMP) WITH MICROSCOPIC
Glucose, UA: NEGATIVE mg/dL
Hgb urine dipstick: NEGATIVE
Ketones, ur: NEGATIVE mg/dL
Leukocytes, UA: NEGATIVE
NITRITE: NEGATIVE
PH: 8.5 — AB (ref 5.0–8.0)
Protein, ur: NEGATIVE mg/dL
SPECIFIC GRAVITY, URINE: 1.015 (ref 1.005–1.030)

## 2017-05-30 LAB — LIPASE, BLOOD: LIPASE: 26 U/L (ref 11–51)

## 2017-05-30 MED ORDER — ONDANSETRON 8 MG PO TBDP: 8.0000 mg | ORAL_TABLET | Freq: Three times a day (TID) | ORAL | 0 refills | Status: DC | PRN

## 2017-05-30 MED ORDER — HYDROCODONE-ACETAMINOPHEN 5-325 MG PO TABS: ORAL_TABLET | ORAL | 0 refills | Status: DC

## 2017-05-30 NOTE — ED Provider Notes (Signed)
[facility]    CSN: [id number] Arrival date & time: 05/30/17  1029     History   Chief Complaint Chief Complaint  Patient presents with  . Abdominal Pain    HPI Monique Brock is a 52 y.o. female.   52 yo female with a c/o mid upper abdominal pain and right upper quadrant abdominal pain since last night. States pain has subsided since last night when it was severe. Pain seemed to radiate towards the lower abdomen and associated with nausea. Denies any vomiting, diarrhea, constipation, melena, hematochezia, dysuria, hematuria, fevers, chills.    The history is provided by the patient.    Past Medical History:  Diagnosis Date  . Arthritis    BIL KNEES  . Family history of adverse reaction to anesthesia    PTS DAD IS HARD TO WAKE UP  . GERD (gastroesophageal reflux disease)     Patient Active Problem List   Diagnosis Date Noted  . Postoperative state 03/21/2017    Past Surgical History:  Procedure Laterality Date  . ABDOMINAL HYSTERECTOMY    . BREAST CYST ASPIRATION Left 04/25/2009   U/S cyst asp- neg  . CESAREAN SECTION    . COLONOSCOPY    . ENDOMETRIAL ABLATION    . ESOPHAGOGASTRODUODENOSCOPY    . EXCISION OF ENDOMETRIOMA  03/21/2017   Procedure: EXCISION OF ENDOMETRIOSIS IMPLANTS;  Surgeon: Ouida Sills Gwen Her, MD;  Location: [facility];  Service: Gynecology;;  . HEMORRHOID SURGERY N/A 12/08/2014   Procedure: HEMORRHOIDECTOMY;  Surgeon: Leonie Green, MD;  Location: [facility];  Service: General;  Laterality: N/A;  . LAPAROSCOPIC BILATERAL SALPINGECTOMY Bilateral 03/21/2017   Procedure: LAPAROSCOPIC BILATERAL SALPINGECTOMY;  Surgeon: Boykin Nearing, MD;  Location: [facility];  Service: Gynecology;  Laterality: Bilateral;  . LAPAROSCOPIC SUPRACERVICAL HYSTERECTOMY N/A 03/21/2017   Procedure: LAPAROSCOPIC SUPRACERVICAL HYSTERECTOMY;  Surgeon: Schermerhorn, Gwen Her, MD;  Location: [facility];  Service: Gynecology;  Laterality: N/A;  . ORIF  PATELLA    . PATELLAR TENDON REPAIR    . TUBAL LIGATION      OB History    No data available       Home Medications    Prior to Admission medications   Medication Sig Start Date End Date Taking? Authorizing Provider  Multiple Vitamin (MULTIVITAMIN WITH MINERALS) TABS tablet Take 1 tablet by mouth daily.   Yes [provider]  naproxen sodium (ALEVE) 220 MG tablet Take 220 mg by mouth as needed.   Yes [provider]  omeprazole (PRILOSEC) 20 MG capsule Take 20 mg by mouth every morning.    Yes [provider]  HYDROcodone-acetaminophen (NORCO/VICODIN) 5-325 MG tablet 1-2 tabs po q 8 hours prn 05/30/17   Norval Gable, MD  ondansetron (ZOFRAN ODT) 8 MG disintegrating tablet Take 1 tablet (8 mg total) by mouth every 8 (eight) hours as needed. 05/30/17   Norval Gable, MD    Family History Family History  Problem Relation Age of Onset  . Hypertension Mother   . Hyperlipidemia Mother   . Hypertension Father   . Hyperlipidemia Father   . Breast cancer Neg Hx     Social History Social History   Tobacco Use  . Smoking status: Never Smoker  . Smokeless tobacco: Never Used  Substance Use Topics  . Alcohol use: No  . Drug use: No     Allergies   Amoxicillin   Review of Systems Review of Systems   Physical Exam Triage Vital Signs ED Triage Vitals  Enc Vitals Group     BP 05/30/17 1046 132/80     Pulse Rate 05/30/17 1046 81     Resp 05/30/17 1046 16     Temp 05/30/17 1046 98.2 F (36.8 C)     Temp Source 05/30/17 1046 Oral     SpO2 05/30/17 1046 100 %     Weight 05/30/17 1042 175 lb (79.4 kg)     Height 05/30/17 1042 5\' 3"  (1.6 m)     Head Circumference --      Peak Flow --      Pain Score 05/30/17 1042 7     Pain Loc --      Pain Edu? --      Excl. in [place]? --    No data found.  Updated Vital Signs BP 132/80 (BP Location: Left Arm)   Pulse 81   Temp 98.2 F (36.8 C) (Oral)   Resp 16   Ht 5\' 3"  (1.6 m)   Wt 175 lb (79.4 kg)    LMP 03/13/2017   SpO2 100%   BMI 31.00 kg/m   Visual Acuity Right Eye Distance:   Left Eye Distance:   Bilateral Distance:    Right Eye Near:   Left Eye Near:    Bilateral Near:     Physical Exam  Constitutional: She appears well-developed and well-nourished. No distress.  Abdominal: Soft. Bowel sounds are normal. She exhibits no distension and no mass. There is tenderness (mainly right upper and epigastric; no rebound or guarding). There is no rebound and no guarding. No hernia.  Skin: She is not diaphoretic.  Nursing note and vitals reviewed.    UC Treatments / Results  Labs (all labs ordered are listed, but only abnormal results are displayed) Labs Reviewed  URINALYSIS, COMPLETE (UACMP) WITH MICROSCOPIC - Abnormal; Notable for the following components:      Result Value   APPearance HAZY (*)    pH 8.5 (*)    Bilirubin Urine SMALL (*)    Squamous Epithelial / LPF TOO NUMEROUS TO COUNT (*)    Bacteria, UA FEW (*)    All other components within normal limits  CBC WITH DIFFERENTIAL/PLATELET - Abnormal; Notable for the following components:   Lymphs Abs 0.8 (*)    All other components within normal limits  COMPREHENSIVE METABOLIC PANEL - Abnormal; Notable for the following components:   Sodium 133 (*)    CO2 20 (*)    Glucose, Bld 105 (*)    Calcium 8.6 (*)    AST 509 (*)    ALT 396 (*)    Total Bilirubin 1.7 (*)    All other components within normal limits  URINE CULTURE  LIPASE, BLOOD    EKG  EKG Interpretation None       Radiology US Abdomen Limited Ruq  Result Date: 05/30/2017 CLINICAL DATA:  Abdominal pain.  Elevated liver enzymes. EXAM: ULTRASOUND ABDOMEN LIMITED RIGHT UPPER QUADRANT COMPARISON:  None. FINDINGS: Gallbladder: The gallbladder is normally distended. There are layering calculi within the gallbladder lumen measuring up to 1.1 cm. No evidence of gallbladder wall thickening. The sonographic Murphy's sign was recorded is negative. Common  bile duct: Diameter: 2.7 mm Liver: Within normal limits in parenchymal echogenicity. There is a 3.1 x 2.7 x 3.1 cm mass in the subcapsular right lobe of the liver. Portal vein is patent on color Doppler imaging with normal direction of blood flow towards the liver. IMPRESSION: Cholelithiasis without evidence of acute cholecystitis. Normal echogenicity of  the liver. 3.1 cm hyperechoic right liver mass. This may represent a hemangioma or potentially a hypervascular metastatic liver mass. Electronically Signed   By: Fidela Salisbury M.D.   On: 05/30/2017 13:35    Procedures Procedures (including critical care time)  Medications Ordered in UC Medications - No data to display   Initial Impression / Assessment and Plan / UC Course  I have reviewed the triage vital signs and the nursing notes.  Pertinent labs & imaging results that were available during my care of the patient were reviewed by me and considered in my medical decision making (see chart for details).       Final Clinical Impressions(s) / UC Diagnoses   Final diagnoses:  Abdominal pain, right upper quadrant  Calculus of gallbladder without cholecystitis without obstruction  Liver mass    ED Discharge Orders        Ordered    US Abdomen Limited RUQ     05/30/17 1210    ondansetron (ZOFRAN ODT) 8 MG disintegrating tablet  Every 8 hours PRN     05/30/17 1345    HYDROcodone-acetaminophen (NORCO/VICODIN) 5-325 MG tablet     05/30/17 1345     1. Lab results and diagnosis reviewed with patient 2. Recommend abdominal US today (scheduled at [facility]); further acute management pending results; discussed ultrasound results with patient and recommendation to follow for liver mass lesion found on [place] for further assessment and management 3. rx as per orders above; reviewed possible side effects, interactions, risks and benefits  4. Follow up with General Surgeon next week   Controlled Substance Prescriptions [place] Controlled Substance  Registry consulted? Not Applicable   Norval Gable, MD 05/30/17 1349

## 2017-05-30 NOTE — ED Triage Notes (Signed)
Patient c/o upper mid abdominal pain off and on since 2 weeks but patient states that her pain has gotten worse since last night. Patient also reports that she has pain in her upper mid back.  Patient c/o nausea.  Patient denies V/D.

## 2017-06-01 LAB — URINE CULTURE
Culture: NO GROWTH
Special Requests: NORMAL

## 2017-06-02 ENCOUNTER — Telehealth: Payer: Self-pay | Admitting: Emergency Medicine

## 2017-06-02 NOTE — Telephone Encounter (Signed)
Called to follow up after patient's recent visit. Patient states she is feeling a lot better. She states she has an appointment with Dr. Ferman Hamming (surgery) tomorrow.

## 2017-06-03 ENCOUNTER — Encounter: Payer: Self-pay | Admitting: Surgery

## 2017-06-03 ENCOUNTER — Ambulatory Visit: Payer: BLUE CROSS/BLUE SHIELD | Admitting: Surgery

## 2017-06-03 VITALS — BP 158/80 | HR 81 | Temp 97.8°F | Ht 63.0 in | Wt 178.5 lb

## 2017-06-03 DIAGNOSIS — M549 Dorsalgia, unspecified: Secondary | ICD-10-CM

## 2017-06-03 DIAGNOSIS — R1084 Generalized abdominal pain: Secondary | ICD-10-CM

## 2017-06-03 DIAGNOSIS — N921 Excessive and frequent menstruation with irregular cycle: Secondary | ICD-10-CM | POA: Insufficient documentation

## 2017-06-03 DIAGNOSIS — N939 Abnormal uterine and vaginal bleeding, unspecified: Secondary | ICD-10-CM | POA: Insufficient documentation

## 2017-06-03 DIAGNOSIS — Z8781 Personal history of (healed) traumatic fracture: Secondary | ICD-10-CM | POA: Insufficient documentation

## 2017-06-03 NOTE — Progress Notes (Signed)
Surgical Clinic History and Physical  Referring provider:  Schermerhorn, Gwen Her, MD [address] [facility] [place], [place] [address]  HISTORY OF PRESENT ILLNESS (HPI):  52 y.o. female presents for evaluation of Right upper back pain. She reports she first experienced Right upper back pain 3 weeks ago, which awakened her from sleep overnight and at which time she does not remember what she'd eaten for dinner. Over the past week, she's again experienced similar subscapular/Right upper back pain between midnight - 1 am, and she specifies at least one of those episodes was after she'd missed dinner due to her schedule or just not feeling hungry. She denies every having experienced post-prandial pain and recalls the pain has on one or two of these episodes started as a dull ache before bed before waking her from sleep later the same night. Since having been seen at [facility], she describes she has been eating a bland low-fat diet without improvement of her symptoms. She underwent colonoscopy along with EGD ~5-6 years ago for evaluation of PUD, since which her GERD has been well-controlled with Prilosec. She otherwise denies constipation, weight loss, N/V, fever/chills, CP, or SOB.  PAST MEDICAL HISTORY (PMH):  Past Medical History:  Diagnosis Date  . Arthritis    BIL KNEES  . Family history of adverse reaction to anesthesia    PTS DAD IS HARD TO WAKE UP  . GERD (gastroesophageal reflux disease)      PAST SURGICAL HISTORY ([place]):  Past Surgical History:  Procedure Laterality Date  . ABDOMINAL HYSTERECTOMY    . BREAST CYST ASPIRATION Left 04/25/2009   U/S cyst asp- neg  . CESAREAN SECTION    . COLONOSCOPY    . ENDOMETRIAL ABLATION    . ESOPHAGOGASTRODUODENOSCOPY    . EXCISION OF ENDOMETRIOMA  03/21/2017   Procedure: EXCISION OF ENDOMETRIOSIS IMPLANTS;  Surgeon: Ouida Sills Gwen Her, MD;  Location: [facility];  Service: Gynecology;;  . HEMORRHOID SURGERY  N/A 12/08/2014   Procedure: HEMORRHOIDECTOMY;  Surgeon: Leonie Green, MD;  Location: [facility];  Service: General;  Laterality: N/A;  . LAPAROSCOPIC BILATERAL SALPINGECTOMY Bilateral 03/21/2017   Procedure: LAPAROSCOPIC BILATERAL SALPINGECTOMY;  Surgeon: Boykin Nearing, MD;  Location: [facility];  Service: Gynecology;  Laterality: Bilateral;  . LAPAROSCOPIC SUPRACERVICAL HYSTERECTOMY N/A 03/21/2017   Procedure: LAPAROSCOPIC SUPRACERVICAL HYSTERECTOMY;  Surgeon: Schermerhorn, Gwen Her, MD;  Location: [facility];  Service: Gynecology;  Laterality: N/A;  . ORIF PATELLA    . PATELLAR TENDON REPAIR    . TUBAL LIGATION       MEDICATIONS:  Prior to Admission medications   Medication Sig Start Date End Date Taking? Authorizing Provider  HYDROcodone-acetaminophen (NORCO/VICODIN) 5-325 MG tablet 1-2 tabs po q 8 hours prn 05/30/17  Yes Conty, [place], MD  Multiple Vitamin (MULTIVITAMIN WITH MINERALS) TABS tablet Take 1 tablet by mouth daily.   Yes [provider]  naproxen sodium (ALEVE) 220 MG tablet Take 220 mg by mouth as needed.   Yes [provider]  omeprazole (PRILOSEC) 20 MG capsule Take 20 mg by mouth every morning.    Yes [provider]  ondansetron (ZOFRAN ODT) 8 MG disintegrating tablet Take 1 tablet (8 mg total) by mouth every 8 (eight) hours as needed. 05/30/17  Yes Norval Gable, MD     ALLERGIES:  Allergies  Allergen Reactions  . Amoxicillin Hives    ONLY IF GIVEN IN HIGH DOSES.  ANYTHING OVER 500 MG AT A TIME CAUSES HIVES  SOCIAL HISTORY:  Social History   Socioeconomic History  . Marital status: Married    Spouse name: Not on file  . Number of children: Not on file  . Years of education: Not on file  . Highest education level: Not on file  Social Needs  . Financial resource strain: Not on file  . Food insecurity - worry: Not on file  . Food insecurity - inability: Not on file  . Transportation needs - medical: Not on file  .  Transportation needs - non-medical: Not on file  Occupational History  . Not on file  Tobacco Use  . Smoking status: Never Smoker  . Smokeless tobacco: Never Used  Substance and Sexual Activity  . Alcohol use: No  . Drug use: No  . Sexual activity: Not on file  Other Topics Concern  . Not on file  Social History Narrative  . Not on file    The patient currently resides (home / rehab facility / nursing home): Home The patient normally is (ambulatory / bedbound): Ambulatory  FAMILY HISTORY:  Family History  Problem Relation Age of Onset  . Hypertension Mother   . Hyperlipidemia Mother   . Hypertension Father   . Hyperlipidemia Father   . Bone cancer Maternal Aunt   . Stomach cancer Maternal Grandmother   . Leukemia Paternal Grandfather   . Breast cancer Neg Hx     Otherwise negative/non-contributory.  REVIEW OF SYSTEMS:  Constitutional: denies any other weight loss, fever, chills, or sweats  Eyes: denies any other vision changes, history of eye injury  ENT: denies sore throat, hearing problems  Respiratory: denies shortness of breath, wheezing  Cardiovascular: denies chest pain, palpitations  Gastrointestinal: abdominal pain, N/V, and bowel function as per HPI Musculoskeletal: denies any other joint pains or cramps  Skin: Denies any other rashes or skin discolorations Neurological: denies any other headache, dizziness, weakness  Psychiatric: Denies any other depression, anxiety   All other review of systems were otherwise negative   VITAL SIGNS:  BP (!) 158/80   Pulse 81   Temp 97.8 F (36.6 C) (Oral)   Ht 5\' 3"  (1.6 m)   Wt 178 lb 8 oz (81 kg)   LMP 03/13/2017   BMI 31.62 kg/m   PHYSICAL EXAM:  Constitutional:  -- Overweight body habitus  -- Awake, alert, and oriented x3  Eyes:  -- Pupils equally round and reactive to light  -- No scleral icterus  Ear, nose, throat:  -- No jugular venous distension -- No nasal drainage, bleeding Pulmonary:  -- No  crackles  -- Equal breath sounds bilaterally -- Breathing non-labored at rest Cardiovascular:  -- S1, S2 present  -- No pericardial rubs  Gastrointestinal:  -- Abdomen soft, nontender, non-distended, no guarding/rebound  -- No abdominal masses appreciated, pulsatile or otherwise  Musculoskeletal and Integumentary:  -- Wounds or skin discoloration: None appreciated -- Extremities: B/L UE and LE FROM, hands and feet warm, no edema  Neurologic:  -- Motor function: Intact and symmetric -- Sensation: Intact and symmetric  Labs:  CBC    Component Value Date/Time   WBC 6.8 05/30/2017 1107   RBC 4.41 05/30/2017 1107   HGB 13.5 05/30/2017 1107   HCT 38.7 05/30/2017 1107   PLT 230 05/30/2017 1107   MCV 87.7 05/30/2017 1107   MCH 30.6 05/30/2017 1107   MCHC 34.9 05/30/2017 1107   RDW 13.3 05/30/2017 1107   LYMPHSABS 0.8 (L) 05/30/2017 1107   MONOABS 0.4 05/30/2017 1107  EOSABS 0.0 05/30/2017 1107   BASOSABS 0.0 05/30/2017 1107   CMP Latest Ref Rng & Units 05/30/2017 03/19/2017  Glucose 65 - 99 mg/dL 105(H) 94  BUN 6 - 20 mg/dL 7 9  Creatinine 0.44 - 1.00 mg/dL 0.62 0.73  Sodium 135 - 145 mmol/L 133(L) 138  Potassium 3.5 - 5.1 mmol/L 3.8 3.8  Chloride 101 - 111 mmol/L 105 105  CO2 22 - 32 mmol/L 20(L) 25  Calcium 8.9 - 10.3 mg/dL 8.6(L) 9.4  Total Protein 6.5 - 8.1 g/dL 7.7 -  Total Bilirubin 0.3 - 1.2 mg/dL 1.7(H) -  Alkaline Phos 38 - 126 U/L 73 -  AST 15 - 41 U/L 509(H) -  ALT 14 - 54 U/L 396(H) -    Imaging studies:  Limited RUQ Abdominal Ultrasound (05/30/2017) Gallbladder is normally distended. There are layering calculi within the gallbladder lumen measuring up to 1.1 cm. No evidence of gallbladder wall thickening. The sonographic Murphy's sign was recorded is negative. Common bile duct diameter: 2.7 mm  Liver parenchymal echogenicity is within normal limits. There is  a 3.1 x 2.7 x 3.1 cm mass in the subcapsular right lobe of the liver.  Portal vein is patent on  color Doppler imaging with normal direction  of blood flow towards the liver. This may represent a hemangioma or potentially a hypervascular metastatic liver mass.  Assessment/Plan:  52 y.o. female with recurrent overnight non-post-prandial Right upper back pain in the context of cholelithiasis and hepatic mass on RUQ abdominal ultrasound, complicated by hyperbilirubinemia and elevated hepatic transaminases as well as co-morbidities including obesity (BMI 32), GERD, osteoarthritis, and recent TAH without BSO for endometriosis.   - minimize/avoid fatty foods (meats, cheeses/dairy, fried foods)  - prefer low-fat foods, such as vegetables, whole grains, and fruits  - considering liver mass in context of symptoms atypical for gallbladder disease, will check CT with contrast for further characterization of liver mass  - follow-up/trend repeat LFT's to assess whether up.down or stably elevated  - return to clinic following completion of the above workup  - instructed to call if any questions or concerns  All of the above recommendations were discussed with the patient, and all of patient's questions were answered to her expressed satisfaction.  Thank you for the opportunity to participate in this patient's care.  -- Marilynne Drivers Rosana Hoes, MD, [place]: [facility] - Partnering for exceptional care. Office: [phone]

## 2017-06-03 NOTE — Patient Instructions (Signed)
Low-Fat Diet for Gallbladder Conditions A low-fat diet can be helpful if you have pancreatitis or a gallbladder condition. With these conditions, your pancreas and gallbladder have trouble digesting fats. A healthy eating plan with less fat will help rest your pancreas and gallbladder and reduce your symptoms. WHAT DO I NEED TO KNOW ABOUT THIS DIET?  Eat a low-fat diet.  Reduce your fat intake to less than 20-30% of your total daily calories. This is less than 50-60 g of fat per day.  Remember that you need some fat in your diet. Ask your dietician what your daily goal should be.  Choose nonfat and low-fat healthy foods. Look for the words "nonfat," "low fat," or "fat free."  As a guide, look on the label and choose foods with less than 3 g of fat per serving. Eat only one serving.  Avoid alcohol.  Do not smoke. If you need help quitting, talk with your health care provider.  Eat small frequent meals instead of three large heavy meals. WHAT FOODS CAN I EAT? Grains Include healthy grains and starches such as potatoes, wheat bread, fiber-rich cereal, and brown rice. Choose whole grain options whenever possible. In adults, whole grains should account for 45-65% of your daily calories.  Fruits and Vegetables Eat plenty of fruits and vegetables. Fresh fruits and vegetables add fiber to your diet. Meats and Other Protein Sources Eat lean meat such as chicken and pork. Trim any fat off of meat before cooking it. Eggs, fish, and beans are other sources of protein. In adults, these foods should account for 10-35% of your daily calories. Dairy Choose low-fat milk and dairy options. Dairy includes fat and protein, as well as calcium.  Fats and Oils Limit high-fat foods such as fried foods, sweets, baked goods, sugary drinks.  Other Creamy sauces and condiments, such as mayonnaise, can add extra fat. Think about whether or not you need to use them, or use smaller amounts or low fat options. WHAT  FOODS ARE NOT RECOMMENDED?  High fat foods, such as:  [employer].  Ice cream.  [place] toast.  Sweet rolls.  Pizza.  Cheese bread.  Foods covered with batter, butter, creamy sauces, or cheese.  Fried foods.  Sugary drinks and desserts.  Foods that cause gas or bloating   This information is not intended to replace advice given to you by your health care provider. Make sure you discuss any questions you have with your health care provider.   Document Released: 05/04/2013 Document Reviewed: 05/04/2013 Elsevier Interactive Patient Education [employer].      We have scheduled you for a CT Scan of your Abdomen and Pelvis. This has been scheduled at our [place], [place] [address]  location. Please Check-in at 8:45 AM, 15 minutes prior to your scheduled appointment. If you need to reschedule your Scan, you may do so by calling [phone].  You will need to pick up a prep kit at least 24 hours in advance of your Scan: You may pick this up at the [facility] department at [place] Location, or [employer].  Bring a list of medications with you to your appointment and you may have nothing to eat or drink 4 hours prior to your CT Scan.

## 2017-06-04 NOTE — Addendum Note (Signed)
Addended by: Wayna Chalet on: 06/04/2017 08:16 AM   Modules accepted: Orders

## 2017-06-12 ENCOUNTER — Ambulatory Visit
Admission: RE | Admit: 2017-06-12 | Discharge: 2017-06-12 | Disposition: A | Discharge status: Home / Self Care | Payer: BLUE CROSS/BLUE SHIELD | Source: Ambulatory Visit | Attending: Surgery | Admitting: Surgery

## 2017-06-12 ENCOUNTER — Other Ambulatory Visit: Payer: Self-pay | Admitting: Obstetrics and Gynecology

## 2017-06-12 ENCOUNTER — Other Ambulatory Visit
Admission: RE | Admit: 2017-06-12 | Discharge: 2017-06-12 | Disposition: A | Discharge status: Home / Self Care | Payer: BLUE CROSS/BLUE SHIELD | Source: Ambulatory Visit | Attending: Surgery | Admitting: Surgery

## 2017-06-12 DIAGNOSIS — Z01419 Encounter for gynecological examination (general) (routine) without abnormal findings: Secondary | ICD-10-CM | POA: Diagnosis present

## 2017-06-12 DIAGNOSIS — Z1231 Encounter for screening mammogram for malignant neoplasm of breast: Secondary | ICD-10-CM

## 2017-06-12 DIAGNOSIS — D1803 Hemangioma of intra-abdominal structures: Secondary | ICD-10-CM | POA: Diagnosis not present

## 2017-06-12 DIAGNOSIS — R1084 Generalized abdominal pain: Secondary | ICD-10-CM | POA: Insufficient documentation

## 2017-06-12 LAB — COMPREHENSIVE METABOLIC PANEL
ALK PHOS: 67 U/L (ref 38–126)
ALT: 42 U/L (ref 14–54)
AST: 27 U/L (ref 15–41)
Albumin: 4.6 g/dL (ref 3.5–5.0)
Anion gap: 11 (ref 5–15)
BUN: 11 mg/dL (ref 6–20)
CO2: 25 mmol/L (ref 22–32)
CREATININE: 0.71 mg/dL (ref 0.44–1.00)
Calcium: 9.6 mg/dL (ref 8.9–10.3)
Chloride: 103 mmol/L (ref 101–111)
Glucose, Bld: 100 mg/dL — ABNORMAL HIGH (ref 65–99)
Potassium: 4.3 mmol/L (ref 3.5–5.1)
Sodium: 139 mmol/L (ref 135–145)
TOTAL PROTEIN: 8.3 g/dL — AB (ref 6.5–8.1)
Total Bilirubin: 0.8 mg/dL (ref 0.3–1.2)

## 2017-06-12 MED ORDER — IOPAMIDOL (ISOVUE-300) INJECTION 61%
100.0000 mL | Freq: Once | INTRAVENOUS | Status: AC | PRN
  Administered 2017-06-12: 100 mL via INTRAVENOUS

## 2017-06-17 ENCOUNTER — Encounter: Payer: Self-pay | Admitting: Surgery

## 2017-06-17 ENCOUNTER — Ambulatory Visit (INDEPENDENT_AMBULATORY_CARE_PROVIDER_SITE_OTHER): Payer: BLUE CROSS/BLUE SHIELD | Admitting: Surgery

## 2017-06-17 VITALS — BP 128/84 | HR 76 | Temp 98.0°F | Ht 63.0 in | Wt 175.0 lb

## 2017-06-17 DIAGNOSIS — K8020 Calculus of gallbladder without cholecystitis without obstruction: Secondary | ICD-10-CM

## 2017-06-17 NOTE — Progress Notes (Signed)
Surgical Clinic Progress/Follow-up Note   HPI:  52 y.o. Female presents to clinic for follow-up evaluation of intermittent post-prandial RUQ abdominal pain associated with cholelithiasis and a liver mass on ultrasound suggestive of hemangioma. Patient reports her abdominal pain completely resolved after she modified her diet to avoid meats, cheeses/dairy, and fried foods. She otherwise denies any fever/chills, N/V, diarrhea, constipation, CP, or SOB and continues to take PPI for GERD.  Review of Systems:  Constitutional: denies any other weight loss, fever, chills, or sweats  Eyes: denies any other vision changes, history of eye injury  ENT: denies sore throat, hearing problems  Respiratory: denies shortness of breath, wheezing  Cardiovascular: denies chest pain, palpitations  Gastrointestinal: abdominal pain, N/V, and bowel function as per HPI Musculoskeletal: denies any other joint pains or cramps  Skin: Denies any other rashes or skin discolorations  Neurological: denies any other headache, dizziness, weakness  Psychiatric: denies any other depression, anxiety  All other review of systems: otherwise negative   Vital Signs:  BP 128/84   Pulse 76   Temp 98 F (36.7 C) (Oral)   Ht 5\' 3"  (1.6 m)   Wt 175 lb (79.4 kg)   LMP 03/13/2017   BMI 31.00 kg/m    Physical Exam:  Constitutional:  -- Overweight body habitus  -- Awake, alert, and oriented x3  Eyes:  -- Pupils equally round and reactive to light  -- No scleral icterus  Ear, nose, throat:  -- No jugular venous distension  -- No nasal drainage, bleeding Pulmonary:  -- No crackles -- Equal breath sounds bilaterally -- Breathing non-labored at rest Cardiovascular:  -- S1, S2 present  -- No pericardial rubs  Gastrointestinal:  -- Soft and non-distended with minimal RUQ abdominal tenderness to palpation, no guarding/rebound  -- No abdominal masses appreciated, pulsatile or otherwise  Musculoskeletal / Integumentary:   -- Wounds or skin discoloration: None appreciated  -- Extremities: B/L UE and LE FROM, hands and feet warm, no edema  Neurologic:  -- Motor function: intact and symmetric  -- Sensation: intact and symmetric   Laboratory studies:  CBC    Component Value Date/Time   WBC 6.8 05/30/2017 1107   RBC 4.41 05/30/2017 1107   HGB 13.5 05/30/2017 1107   HCT 38.7 05/30/2017 1107   PLT 230 05/30/2017 1107   MCV 87.7 05/30/2017 1107   MCH 30.6 05/30/2017 1107   MCHC 34.9 05/30/2017 1107   RDW 13.3 05/30/2017 1107   LYMPHSABS 0.8 (L) 05/30/2017 1107   MONOABS 0.4 05/30/2017 1107   EOSABS 0.0 05/30/2017 1107   BASOSABS 0.0 05/30/2017 1107   CMP Latest Ref Rng & Units 06/12/2017 05/30/2017 03/19/2017  Glucose 65 - 99 mg/dL 100(H) 105(H) 94  BUN 6 - 20 mg/dL 11 7 9   Creatinine 0.44 - 1.00 mg/dL 0.71 0.62 0.73  Sodium 135 - 145 mmol/L 139 133(L) 138  Potassium 3.5 - 5.1 mmol/L 4.3 3.8 3.8  Chloride 101 - 111 mmol/L 103 105 105  CO2 22 - 32 mmol/L 25 20(L) 25  Calcium 8.9 - 10.3 mg/dL 9.6 8.6(L) 9.4  Total Protein 6.5 - 8.1 g/dL 8.3(H) 7.7 -  Total Bilirubin 0.3 - 1.2 mg/dL 0.8 1.7(H) -  Alkaline Phos 38 - 126 U/L 67 73 -  AST 15 - 41 U/L 27 509(H) -  ALT 14 - 54 U/L 42 396(H) -    Imaging:  CT Abdomen and Pelvis with Contrast (06/12/2017) - personally reviewed and discussed with patient and her husband Peripheral  nodular enhancing hypodense mass over the right lobe of the liver measuring 3.7 cm likely a hemangioma and without significant change compared to the previous ultrasound. Gallbladder and biliary tree are normal.  Limited RUQ Abdominal Ultrasound (05/30/2017) - personally reviewed and discussed with patient and her husband The gallbladder is normally distended. There are layering calculi within the gallbladder lumen measuring up to 1.1 cm. No evidence of gallbladder wall thickening. The sonographic Murphy's sign was recorded is negative. Common bile duct diameter: 2.7  mm.  Liver parenchymal echogenicity is within normal limits. There is  a 3.1 x 2.7 x 3.1 cm mass in the subcapsular right lobe of the liver.  Portal vein is patent on color Doppler imaging with normal  direction of blood flow towards the liver.  Assessment:  52 y.o. yo Female with a problem list including...  Patient Active Problem List   Diagnosis Date Noted  . Abnormal uterine bleeding (AUB) 06/03/2017  . History of fracture 06/03/2017  . Menorrhagia with irregular cycle 06/03/2017  . Postoperative state 03/21/2017    presents to clinic for follow-up evaluation of post-prandial RUQ abdominal pain following fatty foods in particular and relieved with cessation of fatty foods, consistent with symptomatic cholelithiasis, complicated by recent hyperbilirubinemia and hypertransaminasemia with likely incidental visualization of asymptomatic 3.7 cm liver hemangioma and by comorbidities including obesity (BMI 31) and relatively recent hysterectomy.  Plan:   - CT results discussed with patient and her husband  - all risks, benefits, and alternatives to cholecystectomy were discussed with the patient and her husband, all of their questions were answered to their expressed satisfaction, patient expresses she wants to think about cholecystectomy vs continuing to avoid more fatty foods.  - all of patient's and her husband's questions answered to their expressed satisfaction, and patient anticipates she will likely call to schedule surgery for this month  - return to clinic as needed, instructed to call office if any questions or concerns  All of the above recommendations were discussed with the patient and patient's family, and all of patient's and family's questions were answered to their expressed satisfaction.  -- Marilynne Drivers Rosana Hoes, MD, [place]: [facility] - Partnering for exceptional care. Office: [phone]

## 2017-06-17 NOTE — Patient Instructions (Signed)
Please call our office if you decide to move forward with having surgery. °

## 2017-06-17 NOTE — H&P (View-Only) (Signed)
Surgical Clinic Progress/Follow-up Note   HPI:  52 y.o. Female presents to clinic for follow-up evaluation of intermittent post-prandial RUQ abdominal pain associated with cholelithiasis and a liver mass on ultrasound suggestive of hemangioma. Patient reports her abdominal pain completely resolved after she modified her diet to avoid meats, cheeses/dairy, and fried foods. She otherwise denies any fever/chills, N/V, diarrhea, constipation, CP, or SOB and continues to take PPI for GERD.  Review of Systems:  Constitutional: denies any other weight loss, fever, chills, or sweats  Eyes: denies any other vision changes, history of eye injury  ENT: denies sore throat, hearing problems  Respiratory: denies shortness of breath, wheezing  Cardiovascular: denies chest pain, palpitations  Gastrointestinal: abdominal pain, N/V, and bowel function as per HPI Musculoskeletal: denies any other joint pains or cramps  Skin: Denies any other rashes or skin discolorations  Neurological: denies any other headache, dizziness, weakness  Psychiatric: denies any other depression, anxiety  All other review of systems: otherwise negative   Vital Signs:  BP 128/84   Pulse 76   Temp 98 F (36.7 C) (Oral)   Ht 5\' 3"  (1.6 m)   Wt 175 lb (79.4 kg)   LMP 03/13/2017   BMI 31.00 kg/m    Physical Exam:  Constitutional:  -- Overweight body habitus  -- Awake, alert, and oriented x3  Eyes:  -- Pupils equally round and reactive to light  -- No scleral icterus  Ear, nose, throat:  -- No jugular venous distension  -- No nasal drainage, bleeding Pulmonary:  -- No crackles -- Equal breath sounds bilaterally -- Breathing non-labored at rest Cardiovascular:  -- S1, S2 present  -- No pericardial rubs  Gastrointestinal:  -- Soft and non-distended with minimal RUQ abdominal tenderness to palpation, no guarding/rebound  -- No abdominal masses appreciated, pulsatile or otherwise  Musculoskeletal / Integumentary:   -- Wounds or skin discoloration: None appreciated  -- Extremities: B/L UE and LE FROM, hands and feet warm, no edema  Neurologic:  -- Motor function: intact and symmetric  -- Sensation: intact and symmetric   Laboratory studies:  CBC    Component Value Date/Time   WBC 6.8 05/30/2017 1107   RBC 4.41 05/30/2017 1107   HGB 13.5 05/30/2017 1107   HCT 38.7 05/30/2017 1107   PLT 230 05/30/2017 1107   MCV 87.7 05/30/2017 1107   MCH 30.6 05/30/2017 1107   MCHC 34.9 05/30/2017 1107   RDW 13.3 05/30/2017 1107   LYMPHSABS 0.8 (L) 05/30/2017 1107   MONOABS 0.4 05/30/2017 1107   EOSABS 0.0 05/30/2017 1107   BASOSABS 0.0 05/30/2017 1107   CMP Latest Ref Rng & Units 06/12/2017 05/30/2017 03/19/2017  Glucose 65 - 99 mg/dL 100(H) 105(H) 94  BUN 6 - 20 mg/dL 11 7 9   Creatinine 0.44 - 1.00 mg/dL 0.71 0.62 0.73  Sodium 135 - 145 mmol/L 139 133(L) 138  Potassium 3.5 - 5.1 mmol/L 4.3 3.8 3.8  Chloride 101 - 111 mmol/L 103 105 105  CO2 22 - 32 mmol/L 25 20(L) 25  Calcium 8.9 - 10.3 mg/dL 9.6 8.6(L) 9.4  Total Protein 6.5 - 8.1 g/dL 8.3(H) 7.7 -  Total Bilirubin 0.3 - 1.2 mg/dL 0.8 1.7(H) -  Alkaline Phos 38 - 126 U/L 67 73 -  AST 15 - 41 U/L 27 509(H) -  ALT 14 - 54 U/L 42 396(H) -    Imaging:  CT Abdomen and Pelvis with Contrast (06/12/2017) - personally reviewed and discussed with patient and her husband Peripheral  nodular enhancing hypodense mass over the right lobe of the liver measuring 3.7 cm likely a hemangioma and without significant change compared to the previous ultrasound. Gallbladder and biliary tree are normal.  Limited RUQ Abdominal Ultrasound (05/30/2017) - personally reviewed and discussed with patient and her husband The gallbladder is normally distended. There are layering calculi within the gallbladder lumen measuring up to 1.1 cm. No evidence of gallbladder wall thickening. The sonographic Murphy's sign was recorded is negative. Common bile duct diameter: 2.7  mm.  Liver parenchymal echogenicity is within normal limits. There is  a 3.1 x 2.7 x 3.1 cm mass in the subcapsular right lobe of the liver.  Portal vein is patent on color Doppler imaging with normal  direction of blood flow towards the liver.  Assessment:  52 y.o. yo Female with a problem list including...  Patient Active Problem List   Diagnosis Date Noted  . Abnormal uterine bleeding (AUB) 06/03/2017  . History of fracture 06/03/2017  . Menorrhagia with irregular cycle 06/03/2017  . Postoperative state 03/21/2017    presents to clinic for follow-up evaluation of post-prandial RUQ abdominal pain following fatty foods in particular and relieved with cessation of fatty foods, consistent with symptomatic cholelithiasis, complicated by recent hyperbilirubinemia and hypertransaminasemia with likely incidental visualization of asymptomatic 3.7 cm liver hemangioma and by comorbidities including obesity (BMI 31) and relatively recent hysterectomy.  Plan:   - CT results discussed with patient and her husband  - all risks, benefits, and alternatives to cholecystectomy were discussed with the patient and her husband, all of their questions were answered to their expressed satisfaction, patient expresses she wants to think about cholecystectomy vs continuing to avoid more fatty foods.  - all of patient's and her husband's questions answered to their expressed satisfaction, and patient anticipates she will likely call to schedule surgery for this month  - return to clinic as needed, instructed to call office if any questions or concerns  All of the above recommendations were discussed with the patient and patient's family, and all of patient's and family's questions were answered to their expressed satisfaction.  -- Marilynne Drivers Rosana Hoes, MD, [place]: [facility] - Partnering for exceptional care. Office: [phone]

## 2017-06-18 ENCOUNTER — Encounter: Payer: Self-pay | Admitting: Surgery

## 2017-06-18 NOTE — Addendum Note (Signed)
Addended by: Priscille Heidelberg on: 06/18/2017 05:39 PM   Modules accepted: Orders, SmartSet

## 2017-06-19 ENCOUNTER — Other Ambulatory Visit: Payer: Self-pay

## 2017-06-19 ENCOUNTER — Encounter
Admission: RE | Admit: 2017-06-19 | Discharge: 2017-06-19 | Disposition: A | Discharge status: Home / Self Care | Payer: BLUE CROSS/BLUE SHIELD | Source: Ambulatory Visit | Attending: Surgery | Admitting: Surgery

## 2017-06-19 ENCOUNTER — Telehealth: Payer: Self-pay | Admitting: Surgery

## 2017-06-19 HISTORY — DX: Other specified postprocedural states: Z98.890

## 2017-06-19 HISTORY — DX: Other complications of anesthesia, initial encounter: T88.59XA

## 2017-06-19 HISTORY — DX: Adverse effect of unspecified anesthetic, initial encounter: T41.45XA

## 2017-06-19 HISTORY — DX: Nausea with vomiting, unspecified: R11.2

## 2017-06-19 NOTE — Telephone Encounter (Signed)
Pt advised of pre op date/time and sx date. Sx: 06/27/17 with Dr Audria Nine cholecystectomy.  Pre op: 06/19/17 between 9-1:00pm--phone interview.   Patient made aware to call [phone], between 1-3:00pm the day before surgery, to find out what time to arrive.

## 2017-06-19 NOTE — Patient Instructions (Signed)
Your procedure is scheduled on: 06-27-17 Report to Same Day Surgery 2nd floor medical mall [facility] Entrance-take elevator on left to 2nd floor.  Check in with surgery information desk.) To find out your arrival time please call [phone] between 1PM - 3PM on 06-26-17  Remember: Instructions that are not followed completely may result in serious medical risk, up to and including death, or upon the discretion of your surgeon and anesthesiologist your surgery may need to be rescheduled.    _x___ 1. Do not eat food after midnight the night before your procedure. NO GUM OR CANDY AFTER MIDNIGHT. You may drink clear liquids up to 2 hours before you are scheduled to arrive at the hospital for your procedure.  Do not drink clear liquids within 2 hours of your scheduled arrival to the hospital.  Clear liquids include  --Water or Apple juice without pulp  --Clear carbohydrate beverage such as ClearFast or Gatorade  --Black Coffee or Clear Tea (No milk, no creamers, do not add anything to the coffee or Tea    __x__ 2. No Alcohol for 24 hours before or after surgery.   __x__3. No Smoking or e-cigarettes for 24 prior to surgery.  Do not use any chewable tobacco products for at least 6 hour prior to surgery   ____  4. Bring all medications with you on the day of surgery if instructed.    __x__ 5. Notify your doctor if there is any change in your medical condition     (cold, fever, infections).    x___6. On the morning of surgery brush your teeth with toothpaste and water.  You may rinse your mouth with mouth wash if you wish.  Do not swallow any toothpaste or mouthwash.   Do not wear jewelry, make-up, hairpins, clips or nail polish.  Do not wear lotions, powders, or perfumes. You may wear deodorant.  Do not shave 48 hours prior to surgery. Men may shave face and neck.  Do not bring valuables to the hospital.    [facility] is not responsible for any belongings or valuables.    Contacts, dentures or bridgework may not be worn into surgery.  Leave your suitcase in the car. After surgery it may be brought to your room.  For patients admitted to the hospital, discharge time is determined by your treatment team.  _  Patients discharged the day of surgery will not be allowed to drive home.  You will need someone to drive you home and stay with you the night of your procedure.    Please read over the following fact sheets that you were given:   [facility] Preparing for Surgery and or MRSA Information   _x___ TAKE THE FOLLOWING MEDICATION THE MORNING OF SURGERY WITH A SMALL SIP OF WATER.  These include:  1. PRILOSEC  2. TAKE A PRILOSEC Thursday NIGHT BEFORE BED  3. YOU MAY TAKE HYDROCODONE AM OF SURGERY IF NEEDED WITH A SMALL SIP OF WATER  4.  5.  6.  ____Fleets enema or Magnesium Citrate as directed.   ____ Use CHG Soap or sage wipes as directed on instruction sheet   ____ Use inhalers on the day of surgery and bring to hospital day of surgery  ____ Stop Metformin and Janumet 2 days prior to surgery.    ____ Take 1/2 of usual insulin dose the night before surgery and none on the morning surgery.   ____ Follow recommendations from Cardiologist, Pulmonologist or PCP regarding stopping Aspirin,  Coumadin, Plavix ,Eliquis, Effient, or Pradaxa, and Pletal.  X____Stop Anti-inflammatories such as Advil, ALEVE, IBUPROFEN, Motrin, Naproxen, Naprosyn, Goodies powders or aspirin products. OK to take Tylenol OR HYDROCODONE IF NEEDED   ____ Stop supplements until after surgery.     ____ Bring C-Pap to the hospital.

## 2017-06-24 ENCOUNTER — Ambulatory Visit: Payer: Self-pay

## 2017-06-26 MED ORDER — CIPROFLOXACIN IN D5W 400 MG/200ML IV SOLN
400.0000 mg | INTRAVENOUS | Status: AC
  Administered 2017-06-27: 400 mg via INTRAVENOUS

## 2017-06-27 ENCOUNTER — Ambulatory Visit
Admission: RE | Admit: 2017-06-27 | Discharge: 2017-06-27 | Disposition: A | Discharge status: Home / Self Care | Payer: BLUE CROSS/BLUE SHIELD | Source: Ambulatory Visit | Attending: Surgery | Admitting: Surgery

## 2017-06-27 ENCOUNTER — Other Ambulatory Visit: Payer: Self-pay

## 2017-06-27 ENCOUNTER — Ambulatory Visit: Payer: BLUE CROSS/BLUE SHIELD | Admitting: Certified Registered"

## 2017-06-27 ENCOUNTER — Encounter: Payer: Self-pay | Admitting: *Deleted

## 2017-06-27 ENCOUNTER — Encounter
Admission: RE | Disposition: A | Discharge status: Home / Self Care | Payer: Self-pay | Source: Ambulatory Visit | Attending: Surgery

## 2017-06-27 DIAGNOSIS — K8010 Calculus of gallbladder with chronic cholecystitis without obstruction: Secondary | ICD-10-CM | POA: Diagnosis not present

## 2017-06-27 DIAGNOSIS — K8012 Calculus of gallbladder with acute and chronic cholecystitis without obstruction: Secondary | ICD-10-CM

## 2017-06-27 DIAGNOSIS — Z79899 Other long term (current) drug therapy: Secondary | ICD-10-CM | POA: Insufficient documentation

## 2017-06-27 DIAGNOSIS — K219 Gastro-esophageal reflux disease without esophagitis: Secondary | ICD-10-CM | POA: Insufficient documentation

## 2017-06-27 HISTORY — PX: CHOLECYSTECTOMY: SHX55

## 2017-06-27 SURGERY — LAPAROSCOPIC CHOLECYSTECTOMY
Anesthesia: General | Site: Abdomen | Wound class: Clean Contaminated

## 2017-06-27 MED ORDER — ROCURONIUM BROMIDE 50 MG/5ML IV SOLN
INTRAVENOUS | Status: AC
  Filled 2017-06-27: qty 1

## 2017-06-27 MED ORDER — LIDOCAINE HCL (CARDIAC) 20 MG/ML IV SOLN
INTRAVENOUS | Status: DC | PRN
  Administered 2017-06-27: 40 mg via INTRAVENOUS

## 2017-06-27 MED ORDER — LIDOCAINE HCL (PF) 2 % IJ SOLN
INTRAMUSCULAR | Status: AC
  Filled 2017-06-27: qty 10

## 2017-06-27 MED ORDER — LIDOCAINE HCL 1 % IJ SOLN
INTRAMUSCULAR | Status: DC | PRN
  Administered 2017-06-27: 22 mL

## 2017-06-27 MED ORDER — DEXAMETHASONE SODIUM PHOSPHATE 10 MG/ML IJ SOLN
INTRAMUSCULAR | Status: AC
Start: 2017-06-27 — End: ?
  Filled 2017-06-27: qty 1

## 2017-06-27 MED ORDER — HYDROMORPHONE HCL 1 MG/ML IJ SOLN
INTRAMUSCULAR | Status: AC
  Filled 2017-06-27: qty 1

## 2017-06-27 MED ORDER — BUPIVACAINE HCL (PF) 0.5 % IJ SOLN
INTRAMUSCULAR | Status: AC
  Filled 2017-06-27: qty 30

## 2017-06-27 MED ORDER — PROMETHAZINE HCL 25 MG/ML IJ SOLN
12.5000 mg | Freq: Once | INTRAMUSCULAR | Status: AC
  Administered 2017-06-27: 12.5 mg via INTRAVENOUS

## 2017-06-27 MED ORDER — DEXAMETHASONE SODIUM PHOSPHATE 10 MG/ML IJ SOLN
INTRAMUSCULAR | Status: DC | PRN
  Administered 2017-06-27: 10 mg via INTRAVENOUS

## 2017-06-27 MED ORDER — HYDROCODONE-ACETAMINOPHEN 5-325 MG PO TABS: 1.0000 | ORAL_TABLET | ORAL | 0 refills | Status: DC | PRN

## 2017-06-27 MED ORDER — CIPROFLOXACIN IN D5W 400 MG/200ML IV SOLN
INTRAVENOUS | Status: AC
  Filled 2017-06-27: qty 200

## 2017-06-27 MED ORDER — SUGAMMADEX SODIUM 200 MG/2ML IV SOLN
INTRAVENOUS | Status: AC
  Filled 2017-06-27: qty 2

## 2017-06-27 MED ORDER — LACTATED RINGERS IV SOLN
INTRAVENOUS | Status: DC
  Administered 2017-06-27 (×3): via INTRAVENOUS

## 2017-06-27 MED ORDER — MIDAZOLAM HCL 2 MG/2ML IJ SOLN
INTRAMUSCULAR | Status: DC | PRN
  Administered 2017-06-27 (×2): 2 mg via INTRAVENOUS

## 2017-06-27 MED ORDER — ACETAMINOPHEN 10 MG/ML IV SOLN
INTRAVENOUS | Status: AC
  Filled 2017-06-27: qty 100

## 2017-06-27 MED ORDER — PROMETHAZINE HCL 25 MG/ML IJ SOLN
INTRAMUSCULAR | Status: AC
  Administered 2017-06-27: 12.5 mg via INTRAVENOUS
  Filled 2017-06-27: qty 1

## 2017-06-27 MED ORDER — SUGAMMADEX SODIUM 200 MG/2ML IV SOLN
INTRAVENOUS | Status: DC | PRN
  Administered 2017-06-27: 350 mg via INTRAVENOUS

## 2017-06-27 MED ORDER — SODIUM CHLORIDE FLUSH 0.9 % IV SOLN
INTRAVENOUS | Status: AC
  Filled 2017-06-27: qty 10

## 2017-06-27 MED ORDER — FENTANYL CITRATE (PF) 100 MCG/2ML IJ SOLN
INTRAMUSCULAR | Status: AC
  Filled 2017-06-27: qty 2

## 2017-06-27 MED ORDER — ONDANSETRON HCL 4 MG/2ML IJ SOLN
INTRAMUSCULAR | Status: AC
  Filled 2017-06-27: qty 2

## 2017-06-27 MED ORDER — ACETAMINOPHEN 10 MG/ML IV SOLN
INTRAVENOUS | Status: DC | PRN
  Administered 2017-06-27: 1000 mg via INTRAVENOUS

## 2017-06-27 MED ORDER — CHLORHEXIDINE GLUCONATE CLOTH 2 % EX PADS: 6.0000 | MEDICATED_PAD | Freq: Once | CUTANEOUS | Status: DC

## 2017-06-27 MED ORDER — GLYCOPYRROLATE 0.2 MG/ML IJ SOLN
INTRAMUSCULAR | Status: DC | PRN
  Administered 2017-06-27: 0.1 mg via INTRAVENOUS

## 2017-06-27 MED ORDER — PROPOFOL 10 MG/ML IV BOLUS
INTRAVENOUS | Status: DC | PRN
  Administered 2017-06-27: 50 mg via INTRAVENOUS
  Administered 2017-06-27: 150 mg via INTRAVENOUS

## 2017-06-27 MED ORDER — MIDAZOLAM HCL 2 MG/2ML IJ SOLN
INTRAMUSCULAR | Status: AC
  Filled 2017-06-27: qty 2

## 2017-06-27 MED ORDER — FENTANYL CITRATE (PF) 100 MCG/2ML IJ SOLN: 25.0000 ug | INTRAMUSCULAR | Status: DC | PRN

## 2017-06-27 MED ORDER — ONDANSETRON HCL 4 MG/2ML IJ SOLN
INTRAMUSCULAR | Status: DC | PRN
  Administered 2017-06-27: 4 mg via INTRAVENOUS

## 2017-06-27 MED ORDER — ONDANSETRON HCL 4 MG/2ML IJ SOLN
4.0000 mg | Freq: Once | INTRAMUSCULAR | Status: AC | PRN
  Administered 2017-06-27 (×2): 4 mg via INTRAVENOUS

## 2017-06-27 MED ORDER — ONDANSETRON HCL 4 MG/2ML IJ SOLN
INTRAMUSCULAR | Status: AC
  Administered 2017-06-27: 4 mg via INTRAVENOUS
  Filled 2017-06-27: qty 2

## 2017-06-27 MED ORDER — PROPOFOL 10 MG/ML IV BOLUS
INTRAVENOUS | Status: AC
  Filled 2017-06-27: qty 20

## 2017-06-27 MED ORDER — FENTANYL CITRATE (PF) 100 MCG/2ML IJ SOLN
INTRAMUSCULAR | Status: DC | PRN
  Administered 2017-06-27: 100 ug via INTRAVENOUS
  Administered 2017-06-27: 50 ug via INTRAVENOUS

## 2017-06-27 MED ORDER — HYDROMORPHONE HCL 1 MG/ML IJ SOLN
INTRAMUSCULAR | Status: DC | PRN
  Administered 2017-06-27: 1 mg via INTRAVENOUS

## 2017-06-27 MED ORDER — ROCURONIUM BROMIDE 100 MG/10ML IV SOLN
INTRAVENOUS | Status: DC | PRN
  Administered 2017-06-27: 40 mg via INTRAVENOUS
  Administered 2017-06-27: 10 mg via INTRAVENOUS

## 2017-06-27 MED ORDER — LIDOCAINE HCL (PF) 1 % IJ SOLN
INTRAMUSCULAR | Status: AC
  Filled 2017-06-27: qty 30

## 2017-06-27 MED ORDER — GLYCOPYRROLATE 0.2 MG/ML IJ SOLN
INTRAMUSCULAR | Status: AC
  Filled 2017-06-27: qty 1

## 2017-06-27 SURGICAL SUPPLY — 37 items
APPLIER CLIP ROT 10 11.4 M/L (STAPLE) ×3
CHLORAPREP W/TINT 26ML (MISCELLANEOUS) ×3 IMPLANT
CLIP APPLIE ROT 10 11.4 M/L (STAPLE) ×1 IMPLANT
DECANTER SPIKE VIAL GLASS SM (MISCELLANEOUS) ×6 IMPLANT
DERMABOND ADVANCED (GAUZE/BANDAGES/DRESSINGS) ×2
DERMABOND ADVANCED .7 DNX12 (GAUZE/BANDAGES/DRESSINGS) ×1 IMPLANT
DRESSING SURGICEL FIBRLLR 1X2 (HEMOSTASIS) IMPLANT
DRSG SURGICEL FIBRILLAR 1X2 (HEMOSTASIS)
ELECT REM PT RETURN 9FT ADLT (ELECTROSURGICAL) ×3
ELECTRODE REM PT RTRN 9FT ADLT (ELECTROSURGICAL) ×1 IMPLANT
GLOVE BIO SURGEON STRL SZ7 (GLOVE) ×3 IMPLANT
GLOVE BIOGEL PI IND STRL 7.5 (GLOVE) ×1 IMPLANT
GLOVE BIOGEL PI INDICATOR 7.5 (GLOVE) ×2
GOWN STRL REUS W/ TWL LRG LVL3 (GOWN DISPOSABLE) ×3 IMPLANT
GOWN STRL REUS W/TWL LRG LVL3 (GOWN DISPOSABLE) ×6
GRASPER SUT TROCAR 14GX15 (MISCELLANEOUS) ×3 IMPLANT
IRRIGATION STRYKERFLOW (MISCELLANEOUS) IMPLANT
IRRIGATOR STRYKERFLOW (MISCELLANEOUS)
IV NS 1000ML (IV SOLUTION) ×2
IV NS 1000ML BAXH (IV SOLUTION) ×1 IMPLANT
KIT TURNOVER KIT A (KITS) ×3 IMPLANT
L-HOOK LAP DISP 36CM (ELECTROSURGICAL) ×3
LHOOK LAP DISP 36CM (ELECTROSURGICAL) ×1 IMPLANT
NEEDLE HYPO 22GX1.5 SAFETY (NEEDLE) ×3 IMPLANT
NEEDLE INSUFFLATION 14GA 120MM (NEEDLE) ×3 IMPLANT
NS IRRIG 1000ML POUR BTL (IV SOLUTION) ×3 IMPLANT
PACK LAP CHOLECYSTECTOMY (MISCELLANEOUS) ×3 IMPLANT
PENCIL ELECTRO HAND CTR (MISCELLANEOUS) ×3 IMPLANT
POUCH SPECIMEN RETRIEVAL 10MM (ENDOMECHANICALS) ×3 IMPLANT
SCISSORS METZENBAUM CVD 33 (INSTRUMENTS) IMPLANT
SLEEVE ENDOPATH XCEL 5M (ENDOMECHANICALS) ×6 IMPLANT
SUT MNCRL AB 4-0 PS2 18 (SUTURE) ×3 IMPLANT
SUT VICRYL 0 UR6 27IN ABS (SUTURE) ×3 IMPLANT
SUT VICRYL AB 3-0 FS1 BRD 27IN (SUTURE) ×3 IMPLANT
TROCAR XCEL NON-BLD 11X100MML (ENDOMECHANICALS) ×3 IMPLANT
TROCAR XCEL NON-BLD 5MMX100MML (ENDOMECHANICALS) ×3 IMPLANT
TUBING INSUFFLATION (TUBING) ×3 IMPLANT

## 2017-06-27 NOTE — OR Nursing (Signed)
Dr Amie Critchley ordered 4mg  zofran IV for continued nausea and dry heaves. Zofran given.

## 2017-06-27 NOTE — Op Note (Signed)
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 female who recently presented with post-prandial RUQ > epigastric abdominal pain after eating fatty foods in particular. Ultrasound suggested cholelithiasis without cholecystitis. All risks, benefits, and alternatives to above elective procedures were discussed with the patient, who elected to proceed, and informed consent was accordingly obtained at that time.   DETAILS OF PROCEDURE:  Patient was brought to the operating suite and appropriately identified. General anesthesia was administered along with peri-operative prophylactic IV antibiotics, and endotracheal intubation was performed by anesthesiologist, along with NG/OG tube for gastric decompression. In supine position, operative site was prepped and draped in usual sterile fashion, and following a brief time out, initial 5 mm incision was made in a natural skin crease just above the umbilicus. Fascia was then elevated, and a Verress needle was inserted and its proper position confirmed using aspiration and saline meniscus test.  Upon insufflation of the abdominal cavity with carbon dioxide to a well-tolerated pressure of 12-15 mmHg, 5 mm  peri-umbilical port followed by laparoscope were inserted and used to inspect the abdominal cavity and its contents with no injuries from insertion of the first trochar noted. Three additional trocars were inserted, one at the epigastric position (10 mm) and two along the Right costal margin (5 mm). The table was then placed in reverse Trendelenburg position with the Right side up. Filmy adhesions between the gallbladder and omentum/duodenum/transverse colon were lysed using combined blunt dissection and selective electrocautery. The apex/dome of the gallbladder was grasped with an atraumatic grasper passed through the lateral port and retracted apically over the liver. The infundibulum was also grasped and retracted, exposing Calot's triangle. The peritoneum overlying the gallbladder infundibulum was incised and dissected free of surrounding peritoneal attachments, revealing the cystic duct and cystic artery, which were clipped twice on the patient side and once on the gallbladder specimen side close to the gallbladder. The gallbladder was then dissected from its peritoneal attachments to the liver using electrocautery, and the gallbladder was placed into a laparoscopic specimen bag and removed from the abdominal cavity via the epigastric port site. Hemostasis and secure placement of clips were confirmed, and intra-peritoneal cavity was inspected with no additional findings. PMI laparoscopic fascial closure device was then used to re-approximate fascia at the 10 mm epigastric port site.  All ports were then removed under direct visualization, and abdominal cavity was desuflated. All port sites were irrigated/cleaned, additional local anesthetic was injected at each incision, 3-0 Vicryl was used to re-approximate dermis at 10 mm port site(s), and subcuticular 4-0 Monocryl suture was used to re-approximate skin. Skin was then cleaned, dried, and sterile skin glue was applied. Patient was then safely able to be  awakened, extubated, and transferred to PACU for post-operative monitoring and care.   I was present for all aspects of procedure, and there were no intra-operative complications apparent.

## 2017-06-27 NOTE — Transfer of Care (Signed)
Immediate Anesthesia Transfer of Care Note  Patient: EVALISSE PRAJAPATI  Procedure(s) Performed: Procedure(s): LAPAROSCOPIC CHOLECYSTECTOMY (N/A)  Patient Location: PACU  Anesthesia Type:General  Level of Consciousness: sedated  Airway & Oxygen Therapy: Patient Spontanous Breathing and Patient connected to face mask oxygen  Post-op Assessment: Report given to RN and Post -op Vital signs reviewed and stable  Post vital signs: Reviewed and stable  Last Vitals:  Vitals:   06/27/17 1212 06/27/17 1559  BP: 126/81 140/86  Pulse: 73 100  Resp: 18 17  Temp: 36.9 C (!) 36.3 C  SpO2: 141% 03%    Complications: No apparent anesthesia complications

## 2017-06-27 NOTE — Discharge Instructions (Signed)
In addition to included general post-operative instructions for Laparoscopic Cholecystectomy,  Diet: Resume home heart healthy diet (as discussed).   Activity: No heavy lifting >20 pounds (children, pets, laundry, garbage) or strenuous activity until follow-up, but light activity and walking are encouraged. Do not drive or drink alcohol if taking narcotic pain medications.  Wound care: 2 days after surgery (Sunday, 2/17), may shower/get incision wet with soapy water and pat dry (do not rub incisions), but no baths or submerging incision underwater until follow-up.   Medications: Resume all home medications. For mild to moderate pain: acetaminophen (Tylenol) or ibuprofen/naproxen (if no kidney disease). Combining Tylenol with alcohol can substantially increase your risk of causing liver disease. Narcotic pain medications, if prescribed, can be used for severe pain, though may cause nausea, constipation, and drowsiness. Do not combine Tylenol and Percocet (or similar) within a 6 hour period as Percocet (and similar) contain(s) Tylenol. If you do not need the narcotic pain medication, you do not need to fill the prescription.  Call office [phone]) at any time if any questions, worsening pain, fevers/chills, bleeding, drainage from incision site, or other concerns.

## 2017-06-27 NOTE — Anesthesia Postprocedure Evaluation (Signed)
Anesthesia Post Note  Patient: Monique Brock  Procedure(s) Performed: LAPAROSCOPIC CHOLECYSTECTOMY (N/A Abdomen)  Patient location during evaluation: PACU Anesthesia Type: General Level of consciousness: awake and alert Pain management: pain level controlled Vital Signs Assessment: post-procedure vital signs reviewed and stable Respiratory status: spontaneous breathing, nonlabored ventilation, respiratory function stable and patient connected to nasal cannula oxygen Cardiovascular status: blood pressure returned to baseline and stable Postop Assessment: no apparent nausea or vomiting Anesthetic complications: no     Last Vitals:  Vitals:   06/27/17 1659 06/27/17 1714  BP: 133/78 125/79  Pulse: (!) 104 97  Resp: 15 17  Temp:  36.4 C  SpO2: 94% 95%    Last Pain:  Vitals:   06/27/17 1559  TempSrc: Temporal  PainSc: Asleep                 Precious Haws Venezia Sargeant

## 2017-06-27 NOTE — Interval H&P Note (Signed)
History and Physical Interval Note:  06/27/2017 1:44 PM  Monique Brock  has presented today for surgery, with the diagnosis of CALCULUS OF GALLBLADDER  The various methods of treatment have been discussed with the patient and family. After consideration of risks, benefits and other options for treatment, the patient has consented to  Procedure(s): LAPAROSCOPIC CHOLECYSTECTOMY (N/A) as a surgical intervention .  The patient's history has been reviewed, patient examined, no change in status, stable for surgery.  I have reviewed the patient's chart and labs.  Questions were answered to the patient's satisfaction.     Vickie Epley

## 2017-06-27 NOTE — Anesthesia Preprocedure Evaluation (Signed)
Anesthesia Evaluation  Patient identified by MRN, date of birth, ID band Patient awake    Reviewed: Allergy & Precautions, NPO status , Patient's Chart, lab work & pertinent test results  History of Anesthesia Complications (+) PONV, Family history of anesthesia reaction and history of anesthetic complications  Airway Mallampati: III  TM Distance: >3 FB Neck ROM: Full    Dental no notable dental hx.    Pulmonary neg pulmonary ROS, neg PE   breath sounds clear to auscultation- rhonchi (-) wheezing      Cardiovascular Exercise Tolerance: Good (-) hypertension(-) CAD and (-) Past MI negative cardio ROS   Rhythm:Regular Rate:Normal - Systolic murmurs and - Diastolic murmurs    Neuro/Psych negative neurological ROS  negative psych ROS   GI/Hepatic Neg liver ROS, GERD  Medicated and Controlled,  Endo/Other  negative endocrine ROSneg diabetes  Renal/GU negative Renal ROS     Musculoskeletal  (+) Arthritis , Osteoarthritis,    Abdominal (+) + obese,   Peds negative pediatric ROS (+)  Hematology negative hematology ROS (+)   Anesthesia Other Findings Past Medical History: No date: Arthritis     Comment:  BIL KNEES No date: Family history of adverse reaction to anesthesia     Comment:  PTS DAD IS HARD TO WAKE UP No date: GERD (gastroesophageal reflux disease)   Reproductive/Obstetrics                             Anesthesia Physical  Anesthesia Plan  ASA: II  Anesthesia Plan: General   Post-op Pain Management:    Induction: Intravenous  PONV Risk Score and Plan: 2 and Dexamethasone and Ondansetron  Airway Management Planned: Oral ETT  Additional Equipment:   Intra-op Plan:   Post-operative Plan: Extubation in OR  Informed Consent: I have reviewed the patients History and Physical, chart, labs and discussed the procedure including the risks, benefits and alternatives for the  proposed anesthesia with the patient or authorized representative who has indicated his/her understanding and acceptance.   Dental advisory given  Plan Discussed with: CRNA and Anesthesiologist  Anesthesia Plan Comments:         Anesthesia Quick Evaluation

## 2017-06-27 NOTE — Anesthesia Post-op Follow-up Note (Signed)
Anesthesia QCDR form completed.        

## 2017-06-28 ENCOUNTER — Encounter: Payer: Self-pay | Admitting: Surgery

## 2017-07-01 LAB — SURGICAL PATHOLOGY

## 2017-07-04 DIAGNOSIS — K8012 Calculus of gallbladder with acute and chronic cholecystitis without obstruction: Secondary | ICD-10-CM

## 2017-07-08 ENCOUNTER — Ambulatory Visit
Admission: RE | Admit: 2017-07-08 | Discharge: 2017-07-08 | Disposition: A | Discharge status: Home / Self Care | Payer: BLUE CROSS/BLUE SHIELD | Source: Ambulatory Visit | Attending: Obstetrics and Gynecology | Admitting: Obstetrics and Gynecology

## 2017-07-08 DIAGNOSIS — Z1231 Encounter for screening mammogram for malignant neoplasm of breast: Secondary | ICD-10-CM | POA: Diagnosis not present

## 2017-07-10 ENCOUNTER — Encounter: Payer: Self-pay | Admitting: Surgery

## 2017-07-10 ENCOUNTER — Ambulatory Visit (INDEPENDENT_AMBULATORY_CARE_PROVIDER_SITE_OTHER): Payer: BLUE CROSS/BLUE SHIELD | Admitting: Surgery

## 2017-07-10 ENCOUNTER — Encounter: Payer: BLUE CROSS/BLUE SHIELD | Admitting: Surgery

## 2017-07-10 VITALS — BP 141/89 | HR 103 | Temp 97.5°F | Ht 63.0 in | Wt 179.8 lb

## 2017-07-10 DIAGNOSIS — K8012 Calculus of gallbladder with acute and chronic cholecystitis without obstruction: Secondary | ICD-10-CM

## 2017-07-10 NOTE — Patient Instructions (Signed)

## 2017-07-10 NOTE — Progress Notes (Signed)
Surgical Clinic Progress/Follow-up Note   HPI:  52 y.o. 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 Patient reports complete resolution of pre-operative post-prandial RUQ abdominal pain and has been tolerating increasingly regular diet with +flatus and normal BM's, denies N/V, fever/chills, CP, or SOB.  Review of Systems:  Constitutional: denies any other weight loss, fever, chills, or sweats  Eyes: denies any other vision changes, history of eye injury  ENT: denies sore throat, hearing problems  Respiratory: denies shortness of breath, wheezing  Cardiovascular: denies chest pain, palpitations  Gastrointestinal: abdominal pain, N/V, and bowel function as per HPI Musculoskeletal: denies any other joint pains or cramps  Skin: Denies any other rashes or skin discolorations  Neurological: denies any other headache, dizziness, weakness  Psychiatric: denies any other depression, anxiety  All other review of systems: otherwise negative   Vital Signs:  BP (!) 141/89   Pulse (!) 103   Temp (!) 97.5 F (36.4 C) (Oral)   Ht 5\' 3"  (1.6 m)   Wt 179 lb 12.8 oz (81.6 kg)   LMP 03/13/2017   BMI 31.85 kg/m    Physical Exam:  Constitutional:  -- Overweight non-obese body habitus  -- Awake, alert, and oriented x3  Eyes:  -- Pupils equally round and reactive to light  -- No scleral icterus  Ear, nose, throat:  -- No jugular venous distension  -- No nasal drainage, bleeding Pulmonary:  -- No crackles -- Equal breath sounds bilaterally -- Breathing non-labored at rest Cardiovascular:  -- S1, S2 present  -- No pericardial rubs  Gastrointestinal:  -- Soft, nontender, non-distended, no guarding/rebound -- Incisions well-approximated without surrounding erythema or drainage -- No abdominal masses appreciated, pulsatile or otherwise  Musculoskeletal / Integumentary:  -- Wounds or skin discoloration: None  appreciated except post-surgical wounds as described above (GI) -- Extremities: B/L UE and LE FROM, hands and feet warm, no edema  Neurologic:  -- Motor function: intact and symmetric  -- Sensation: intact and symmetric   Assessment:  52 y.o. yo Female with a problem list including...  Patient Active Problem List   Diagnosis Date Noted  . Calculus of gallbladder with acute and chronic cholecystitis without obstruction   . Abnormal uterine bleeding (AUB) 06/03/2017  . History of fracture 06/03/2017  . Menorrhagia with irregular cycle 06/03/2017  . 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  Plan:              - advance diet as tolerated              - okay to submerge incisions under water (baths, swimming) prn             - gradually resume all activities without restrictions over next 2 weeks             - apply sunblock particularly to incisions with sun exposure to reduce pigmentation of scars             - return to clinic as needed, instructed to call office if any questions or concerns   All of the above recommendations were discussed with the patient, and all of patient's questions were answered to her expressed satisfaction.  -- Marilynne Drivers Rosana Hoes, MD, [place]: [facility] - Partnering for exceptional care. Office: [phone]

## 2017-07-14 ENCOUNTER — Other Ambulatory Visit: Payer: Self-pay | Admitting: Obstetrics and Gynecology

## 2017-07-14 DIAGNOSIS — N6489 Other specified disorders of breast: Secondary | ICD-10-CM

## 2017-07-14 DIAGNOSIS — R928 Other abnormal and inconclusive findings on diagnostic imaging of breast: Secondary | ICD-10-CM

## 2017-07-21 ENCOUNTER — Other Ambulatory Visit: Payer: Self-pay

## 2017-07-21 ENCOUNTER — Ambulatory Visit: Payer: BLUE CROSS/BLUE SHIELD | Attending: Obstetrics and Gynecology

## 2017-08-13 DIAGNOSIS — M172 Bilateral post-traumatic osteoarthritis of knee: Secondary | ICD-10-CM | POA: Insufficient documentation

## 2017-08-13 DIAGNOSIS — E66811 Obesity, class 1: Secondary | ICD-10-CM | POA: Insufficient documentation

## 2018-06-25 ENCOUNTER — Other Ambulatory Visit: Payer: Self-pay | Admitting: Obstetrics and Gynecology

## 2018-06-25 DIAGNOSIS — N6489 Other specified disorders of breast: Secondary | ICD-10-CM

## 2018-07-10 ENCOUNTER — Ambulatory Visit
Admission: RE | Admit: 2018-07-10 | Discharge: 2018-07-10 | Disposition: A | Discharge status: Home / Self Care | Payer: BLUE CROSS/BLUE SHIELD | Source: Ambulatory Visit | Attending: Obstetrics and Gynecology | Admitting: Obstetrics and Gynecology

## 2018-07-10 DIAGNOSIS — N6489 Other specified disorders of breast: Secondary | ICD-10-CM | POA: Insufficient documentation

## 2019-03-04 ENCOUNTER — Ambulatory Visit: Payer: Self-pay

## 2019-03-04 ENCOUNTER — Other Ambulatory Visit: Payer: Self-pay

## 2019-03-04 VITALS — BP 124/70 | HR 68 | Resp 15 | Ht 64.0 in | Wt 174.0 lb

## 2019-03-04 DIAGNOSIS — Z008 Encounter for other general examination: Secondary | ICD-10-CM

## 2019-03-04 LAB — POCT LIPID PANEL
Glucose Fasting, POC: 101 mg/dL — AB (ref 70–99)
HDL: 47
LDL: 129
Non-HDL: 158
TC/HDL: 4.4
TC: 205
TRG: 144

## 2019-03-04 NOTE — Progress Notes (Signed)
     Patient ID: Monique Brock, female    DOB: [birth year], 53 y.o.   MRN: [id number]    Thank you!!  [place] Nurse Specialist [place]: [phone]  Cell:  [phone] Website: [email or web address]

## 2020-05-18 ENCOUNTER — Other Ambulatory Visit
Admission: RE | Admit: 2020-05-18 | Discharge: 2020-05-18 | Disposition: A | Discharge status: Home / Self Care | Payer: BC Managed Care – PPO | Source: Ambulatory Visit | Attending: Gastroenterology | Admitting: Gastroenterology

## 2020-05-18 ENCOUNTER — Other Ambulatory Visit: Payer: Self-pay

## 2020-05-18 DIAGNOSIS — Z20822 Contact with and (suspected) exposure to covid-19: Secondary | ICD-10-CM | POA: Insufficient documentation

## 2020-05-18 DIAGNOSIS — Z01812 Encounter for preprocedural laboratory examination: Secondary | ICD-10-CM | POA: Diagnosis present

## 2020-05-18 LAB — SARS CORONAVIRUS 2 (TAT 6-24 HRS): SARS Coronavirus 2: NEGATIVE

## 2020-05-22 ENCOUNTER — Ambulatory Visit
Admission: RE | Admit: 2020-05-22 | Discharge: 2020-05-22 | Disposition: A | Discharge status: Home / Self Care | Payer: BC Managed Care – PPO | Attending: Gastroenterology | Admitting: Gastroenterology

## 2020-05-22 ENCOUNTER — Ambulatory Visit: Payer: BC Managed Care – PPO | Admitting: Certified Registered"

## 2020-05-22 ENCOUNTER — Encounter
Admission: RE | Disposition: A | Discharge status: Home / Self Care | Payer: Self-pay | Source: Home / Self Care | Attending: Gastroenterology

## 2020-05-22 ENCOUNTER — Encounter: Payer: Self-pay | Admitting: *Deleted

## 2020-05-22 ENCOUNTER — Other Ambulatory Visit: Payer: Self-pay

## 2020-05-22 DIAGNOSIS — K317 Polyp of stomach and duodenum: Secondary | ICD-10-CM | POA: Diagnosis not present

## 2020-05-22 DIAGNOSIS — Z8371 Family history of colonic polyps: Secondary | ICD-10-CM | POA: Diagnosis not present

## 2020-05-22 DIAGNOSIS — Z1211 Encounter for screening for malignant neoplasm of colon: Secondary | ICD-10-CM | POA: Diagnosis present

## 2020-05-22 DIAGNOSIS — K449 Diaphragmatic hernia without obstruction or gangrene: Secondary | ICD-10-CM | POA: Diagnosis not present

## 2020-05-22 DIAGNOSIS — K640 First degree hemorrhoids: Secondary | ICD-10-CM | POA: Diagnosis not present

## 2020-05-22 DIAGNOSIS — D120 Benign neoplasm of cecum: Secondary | ICD-10-CM | POA: Insufficient documentation

## 2020-05-22 DIAGNOSIS — Q396 Congenital diverticulum of esophagus: Secondary | ICD-10-CM | POA: Insufficient documentation

## 2020-05-22 DIAGNOSIS — Z880 Allergy status to penicillin: Secondary | ICD-10-CM | POA: Insufficient documentation

## 2020-05-22 DIAGNOSIS — K219 Gastro-esophageal reflux disease without esophagitis: Secondary | ICD-10-CM | POA: Insufficient documentation

## 2020-05-22 DIAGNOSIS — Z79899 Other long term (current) drug therapy: Secondary | ICD-10-CM | POA: Insufficient documentation

## 2020-05-22 DIAGNOSIS — K6289 Other specified diseases of anus and rectum: Secondary | ICD-10-CM | POA: Insufficient documentation

## 2020-05-22 HISTORY — PX: ESOPHAGOGASTRODUODENOSCOPY: SHX5428

## 2020-05-22 HISTORY — PX: COLONOSCOPY: SHX5424

## 2020-05-22 SURGERY — EGD (ESOPHAGOGASTRODUODENOSCOPY)
Anesthesia: General

## 2020-05-22 MED ORDER — LIDOCAINE HCL (PF) 2 % IJ SOLN
INTRAMUSCULAR | Status: AC
  Filled 2020-05-22: qty 5

## 2020-05-22 MED ORDER — PROPOFOL 10 MG/ML IV BOLUS
INTRAVENOUS | Status: DC | PRN
  Administered 2020-05-22: 80 mg via INTRAVENOUS

## 2020-05-22 MED ORDER — PROPOFOL 500 MG/50ML IV EMUL
INTRAVENOUS | Status: AC
  Filled 2020-05-22: qty 50

## 2020-05-22 MED ORDER — SODIUM CHLORIDE 0.9 % IV SOLN: INTRAVENOUS | Status: DC

## 2020-05-22 MED ORDER — GLYCOPYRROLATE 0.2 MG/ML IJ SOLN
INTRAMUSCULAR | Status: DC | PRN
  Administered 2020-05-22: .2 mg via INTRAVENOUS

## 2020-05-22 MED ORDER — LIDOCAINE HCL (CARDIAC) PF 100 MG/5ML IV SOSY
PREFILLED_SYRINGE | INTRAVENOUS | Status: DC | PRN
  Administered 2020-05-22: 50 mg via INTRAVENOUS

## 2020-05-22 MED ORDER — PROPOFOL 500 MG/50ML IV EMUL
INTRAVENOUS | Status: DC | PRN
  Administered 2020-05-22: 130 ug/kg/min via INTRAVENOUS

## 2020-05-22 MED ORDER — GLYCOPYRROLATE 0.2 MG/ML IJ SOLN
INTRAMUSCULAR | Status: AC
  Filled 2020-05-22: qty 1

## 2020-05-22 MED ORDER — PROPOFOL 10 MG/ML IV BOLUS
INTRAVENOUS | Status: AC
  Filled 2020-05-22: qty 20

## 2020-05-22 NOTE — Interval H&P Note (Signed)
History and Physical Interval Note:  05/22/2020 8:54 AM  Monique Brock  has presented today for surgery, with the diagnosis of Family history of colon polyps, Reflux.  The various methods of treatment have been discussed with the patient and family. After consideration of risks, benefits and other options for treatment, the patient has consented to  Procedure(s): ESOPHAGOGASTRODUODENOSCOPY (EGD) (N/A) COLONOSCOPY (N/A) as a surgical intervention.  The patient's history has been reviewed, patient examined, no change in status, stable for surgery.  I have reviewed the patient's chart and labs.  Questions were answered to the patient's satisfaction.     Lesly Rubenstein  Ok to proceed with EGD/Colonoscopy

## 2020-05-22 NOTE — Op Note (Signed)
[facility] Gastroenterology Patient Name: Monique Brock Procedure Date: 05/22/2020 8:58 AM MRN: [id number] Account #: [id number] Date of Birth: [birth year] Admit Type: Outpatient Age: 56 Room: [facility] ENDO ROOM 3 Gender: Female Note Status: Finalized Procedure:             Colonoscopy Indications:           Colon cancer screening in patient at increased risk:                         Family history of 1st-degree relative with colon polyps Providers:             Andrey Farmer MD, MD Referring MD:          Boykin Nearing, MD (Referring MD) Medicines:             Monitored Anesthesia Care Complications:         No immediate complications. Estimated blood loss:                         Minimal. Procedure:             Pre-Anesthesia Assessment:                        - Prior to the procedure, a History and Physical was                         performed, and patient medications and allergies were                         reviewed. The patient is competent. The risks and                         benefits of the procedure and the sedation options and                         risks were discussed with the patient. All questions                         were answered and informed consent was obtained.                         Patient identification and proposed procedure were                         verified by the physician, the nurse, the anesthetist                         and the technician in the endoscopy suite. Mental                         Status Examination: alert and oriented. Airway                         Examination: normal oropharyngeal airway and neck                         mobility. Respiratory Examination: clear to  auscultation. CV Examination: normal. Prophylactic                         Antibiotics: The patient does not require prophylactic                         antibiotics. Prior Anticoagulants: The patient has                          taken no previous anticoagulant or antiplatelet                         agents. ASA Grade Assessment: II - A patient with mild                         systemic disease. After reviewing the risks and                         benefits, the patient was deemed in satisfactory                         condition to undergo the procedure. The anesthesia                         plan was to use monitored anesthesia care (MAC).                         Immediately prior to administration of medications,                         the patient was re-assessed for adequacy to receive                         sedatives. The heart rate, respiratory rate, oxygen                         saturations, blood pressure, adequacy of pulmonary                         ventilation, and response to care were monitored                         throughout the procedure. The physical status of the                         patient was re-assessed after the procedure.                        After obtaining informed consent, the colonoscope was                         passed under direct vision. Throughout the procedure,                         the patient's blood pressure, pulse, and oxygen                         saturations were monitored continuously. The  Colonoscope was introduced through the anus and                         advanced to the the cecum, identified by appendiceal                         orifice and ileocecal valve. The colonoscopy was                         performed without difficulty. The patient tolerated                         the procedure well. The quality of the bowel                         preparation was good. Findings:      The perianal and digital rectal examinations were normal.      Two sessile polyps were found in the cecum. The polyps were 3 mm in       size. These polyps were removed with a cold snare. Resection and       retrieval were complete. Estimated  blood loss was minimal.      Non-bleeding internal hemorrhoids were found during retroflexion. The       hemorrhoids were Grade I (internal hemorrhoids that do not prolapse).      Anal papilla(e) were hypertrophied. Appeared slightly abnormal. Biopsies       were taken with a cold forceps for histology. Estimated blood loss was       minimal.      The exam was otherwise without abnormality on direct and retroflexion       views. Impression:            - Two 3 mm polyps in the cecum, removed with a cold                         snare. Resected and retrieved.                        - Non-bleeding internal hemorrhoids.                        - Anal papilla(e) were hypertrophied. Biopsied.                        - The examination was otherwise normal on direct and                         retroflexion views. Recommendation:        - Discharge patient to home.                        - Resume previous diet.                        - Continue present medications.                        - Await pathology results. If biopsied anal papilla  are indeterminate then may need referral to surgery                         for evaluation.                        - Repeat colonoscopy for surveillance based on                         pathology results.                        - Return to referring physician as previously                         scheduled. Procedure Code(s):     --- Professional ---                        [phone], Colonoscopy, flexible; with removal of                         tumor(s), polyp(s), or other lesion(s) by snare                         technique                        45380, 9, Colonoscopy, flexible; with biopsy, single                         or multiple Diagnosis Code(s):     --- Professional ---                        Z83.71, Family history of colonic polyps                        K63.5, Polyp of colon                        K62.89, Other specified diseases  of anus and rectum                        K64.0, First degree hemorrhoids CPT copyright 2019 American [employer] Association. All rights reserved. The codes documented in this report are preliminary and upon coder review may  be revised to meet current compliance requirements. Andrey Farmer MD, MD 05/22/2020 9:44:16 AM Number of Addenda: 0 Note Initiated On: 05/22/2020 8:58 AM Scope Withdrawal Time: 0 hours 15 minutes 1 second  Total Procedure Duration: 0 hours 21 minutes 36 seconds  Estimated Blood Loss:  Estimated blood loss was minimal.      [facility]

## 2020-05-22 NOTE — Op Note (Signed)
[facility] Gastroenterology Patient Name: Monique Brock Procedure Date: 05/22/2020 8:59 AM MRN: [id number] Account #: [id number] Date of Birth: 1965-06-10 Admit Type: Outpatient Age: 55 Room: [facility] ENDO ROOM 3 Gender: Female Note Status: Finalized Procedure:             Upper GI endoscopy Indications:           Gastro-esophageal reflux disease Providers:             Andrey Farmer MD, MD Referring MD:          Boykin Nearing, MD (Referring MD) Medicines:             Monitored Anesthesia Care Complications:         No immediate complications. Estimated blood loss:                         Minimal. Procedure:             Pre-Anesthesia Assessment:                        - Prior to the procedure, a History and Physical was                         performed, and patient medications and allergies were                         reviewed. The patient is competent. The risks and                         benefits of the procedure and the sedation options and                         risks were discussed with the patient. All questions                         were answered and informed consent was obtained.                         Patient identification and proposed procedure were                         verified by the physician, the nurse, the anesthetist                         and the technician in the endoscopy suite. Mental                         Status Examination: alert and oriented. Airway                         Examination: normal oropharyngeal airway and neck                         mobility. Respiratory Examination: clear to                         auscultation. CV Examination: normal. Prophylactic  Antibiotics: The patient does not require prophylactic                         antibiotics. Prior Anticoagulants: The patient has                         taken no previous anticoagulant or antiplatelet                         agents.  ASA Grade Assessment: II - A patient with mild                         systemic disease. After reviewing the risks and                         benefits, the patient was deemed in satisfactory                         condition to undergo the procedure. The anesthesia                         plan was to use monitored anesthesia care (MAC).                         Immediately prior to administration of medications,                         the patient was re-assessed for adequacy to receive                         sedatives. The heart rate, respiratory rate, oxygen                         saturations, blood pressure, adequacy of pulmonary                         ventilation, and response to care were monitored                         throughout the procedure. The physical status of the                         patient was re-assessed after the procedure.                        After obtaining informed consent, the endoscope was                         passed under direct vision. Throughout the procedure,                         the patient's blood pressure, pulse, and oxygen                         saturations were monitored continuously. The Endoscope                         was introduced through the mouth, and advanced to the  second part of duodenum. The upper GI endoscopy was                         accomplished without difficulty. The patient tolerated                         the procedure well. Findings:      A non-bleeding diverticulum with a small opening and no stigmata of       recent bleeding was found at the gastroesophageal junction.      A small hiatal hernia was present.      The exam of the esophagus was otherwise normal.      Multiple 1 to 3 mm sessile polyps with no stigmata of recent bleeding       were found in the gastric body. One polyp was sampled with a cold biopsy       forceps. Resection and retrieval were complete. Estimated blood loss was        minimal.      The examined duodenum was normal. 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                        - Small hiatal hernia.                        - Multiple gastric polyps. Resected and retrieved.                        - Normal examined duodenum. Recommendation:        - Perform a colonoscopy today. Procedure Code(s):     --- Professional ---                        [phone], Esophagogastroduodenoscopy, flexible,                         transoral; with biopsy, single or multiple Diagnosis Code(s):     --- Professional ---                        Q39.6, Congenital diverticulum of esophagus                        K44.9, Diaphragmatic hernia without obstruction or                         gangrene                        K31.7, Polyp of stomach and duodenum                        K21.9, Gastro-esophageal reflux disease without                         esophagitis CPT copyright 2019 American Medical Association. All rights reserved. The codes documented in this report are preliminary and upon coder review may  be revised to meet current compliance requirements. Andrey Farmer MD, MD 05/22/2020 9:40:22 AM Number of Addenda: 0 Note Initiated On: 05/22/2020 8:59 AM Estimated Blood Loss:  Estimated blood loss  was minimal.      [facility]

## 2020-05-22 NOTE — Anesthesia Preprocedure Evaluation (Addendum)
Anesthesia Evaluation  Patient identified by MRN, date of birth, ID band Patient awake    Reviewed: Allergy & Precautions, NPO status , Patient's Chart, lab work & pertinent test results  History of Anesthesia Complications (+) PONV, Family history of anesthesia reaction and history of anesthetic complications  Airway Mallampati: III  TM Distance: >3 FB Neck ROM: Full    Dental  (+) Dental Advidsory Given, Teeth Intact, Caps, Missing   Pulmonary neg pulmonary ROS, Not current smoker, neg PE   breath sounds clear to auscultation- rhonchi (-) wheezing      Cardiovascular Exercise Tolerance: Good (-) hypertension(-) angina(-) CAD and (-) Past MI (-) dysrhythmias  Rhythm:Regular Rate:Normal - Systolic murmurs and - Diastolic murmurs    Neuro/Psych neg Seizures negative neurological ROS  negative psych ROS   GI/Hepatic Neg liver ROS, GERD  ,  Endo/Other  negative endocrine ROSneg diabetes  Renal/GU negative Renal ROS     Musculoskeletal  (+) Arthritis ,   Abdominal (+) + obese,   Peds  Hematology   Anesthesia Other Findings Past Medical History: No date: Arthritis     Comment:  BIL KNEES No date: Family history of adverse reaction to anesthesia     Comment:  PTS DAD IS HARD TO WAKE UP No date: GERD (gastroesophageal reflux disease)   Reproductive/Obstetrics                             Anesthesia Physical  Anesthesia Plan  ASA: II  Anesthesia Plan: General   Post-op Pain Management:    Induction: Intravenous  PONV Risk Score and Plan: 2 and TIVA and Propofol infusion  Airway Management Planned: Natural Airway and Nasal Cannula  Additional Equipment:   Intra-op Plan:   Post-operative Plan:   Informed Consent: I have reviewed the patients History and Physical, chart, labs and discussed the procedure including the risks, benefits and alternatives for the proposed anesthesia with  the patient or authorized representative who has indicated his/her understanding and acceptance.     Dental advisory given  Plan Discussed with: CRNA and Anesthesiologist  Anesthesia Plan Comments:        Anesthesia Quick Evaluation

## 2020-05-22 NOTE — Transfer of Care (Signed)
Immediate Anesthesia Transfer of Care Note  Patient: Monique Brock  Procedure(s) Performed: ESOPHAGOGASTRODUODENOSCOPY (EGD) (N/A ) COLONOSCOPY (N/A )  Patient Location: PACU and Endoscopy Unit  Anesthesia Type:General  Level of Consciousness: drowsy  Airway & Oxygen Therapy: Patient Spontanous Breathing  Post-op Assessment: Report given to RN  Post vital signs: stable  Last Vitals:  Vitals Value Taken Time  BP    Temp    Pulse    Resp    SpO2      Last Pain:  Vitals:   05/22/20 0814  TempSrc: Temporal  PainSc: 0-No pain         Complications: No complications documented.

## 2020-05-22 NOTE — H&P (Signed)
Outpatient short stay form Pre-procedure 05/22/2020 8:52 AM Monique Miyamoto MD, MPH  Primary Physician: Dr. Ouida Sills  Reason for visit:  GERD/Screening  History of present illness:   55 y/o lady with history of GERD and family history of polyps. No family history of GI malignancies. No blood thinners. History of hysterectomy. No new GI symptoms.    Current Facility-Administered Medications:  .  0.9 %  sodium chloride infusion, , Intravenous, Continuous, Breon Diss, Hilton Cork, MD, Last Rate: 20 mL/hr at 05/22/20 7564, Continued from Pre-op at 05/22/20 0835  Medications Prior to Admission  Medication Sig Dispense Refill Last Dose  . pantoprazole (PROTONIX) 40 MG tablet Take 40 mg by mouth 2 (two) times daily.     Marland Kitchen ibuprofen (ADVIL,MOTRIN) 200 MG tablet Take 200 mg by mouth every 8 (eight) hours as needed for mild pain or moderate pain.     . Multiple Vitamin (MULTIVITAMIN WITH MINERALS) TABS tablet Take 1 tablet by mouth daily.     . naproxen sodium (ALEVE) 220 MG tablet Take 220 mg by mouth daily as needed (pain).      Marland Kitchen omeprazole (PRILOSEC) 20 MG capsule Take 20 mg by mouth every morning.         Allergies  Allergen Reactions  . Amoxicillin Hives    ONLY IF GIVEN IN HIGH DOSES.  ANYTHING OVER 500 MG AT A TIME CAUSES HIVES     Past Medical History:  Diagnosis Date  . Arthritis    BIL KNEES  . Calculus of gallbladder with acute and chronic cholecystitis without obstruction   . Complication of anesthesia   . Family history of adverse reaction to anesthesia    PTS DAD IS HARD TO WAKE UP-PATERNAL GRANDMOTHER-VERY SENSITIVE TO ANESTHESIA  . GERD (gastroesophageal reflux disease)   . PONV (postoperative nausea and vomiting)    NORMALLY VOMITS ONCE AFTER ANESTHESIA AND IS FINE AFTER THAT    Review of systems:  Otherwise negative.    Physical Exam  Gen: Alert, oriented. Appears stated age.  HEENT: PERRLA. Lungs: No respiratory distress CV: RRR Abd: soft, benign, no  masses Ext: No edema    Planned procedures: Proceed with EGD/colonoscopy. The patient understands the nature of the planned procedure, indications, risks, alternatives and potential complications including but not limited to bleeding, infection, perforation, damage to internal organs and possible oversedation/side effects from anesthesia. The patient agrees and gives consent to proceed.  Please refer to procedure notes for findings, recommendations and patient disposition/instructions.     Monique Miyamoto MD, MPH Gastroenterology 05/22/2020  8:52 AM

## 2020-05-23 ENCOUNTER — Encounter: Payer: Self-pay | Admitting: Gastroenterology

## 2020-05-23 LAB — SURGICAL PATHOLOGY

## 2020-05-23 NOTE — Anesthesia Postprocedure Evaluation (Signed)
Anesthesia Post Note  Patient: LORRAINE CIMMINO  Procedure(s) Performed: ESOPHAGOGASTRODUODENOSCOPY (EGD) (N/A ) COLONOSCOPY (N/A )  Patient location during evaluation: Endoscopy Anesthesia Type: General Level of consciousness: awake and alert Pain management: pain level controlled Vital Signs Assessment: post-procedure vital signs reviewed and stable Respiratory status: spontaneous breathing, nonlabored ventilation, respiratory function stable and patient connected to nasal cannula oxygen Cardiovascular status: blood pressure returned to baseline and stable Postop Assessment: no apparent nausea or vomiting Anesthetic complications: no   No complications documented.   Last Vitals:  Vitals:   05/22/20 0937 05/22/20 0947  BP: 103/74 115/78  Pulse: 93   Resp: 13   Temp: (!) 36.3 C   SpO2: 93%     Last Pain:  Vitals:   05/23/20 0739  TempSrc:   PainSc: 0-No pain                 Martha Clan

## 2020-06-15 ENCOUNTER — Ambulatory Visit
Admission: EM | Admit: 2020-06-15 | Discharge: 2020-06-15 | Disposition: A | Discharge status: Home / Self Care | Payer: BC Managed Care – PPO | Attending: Family Medicine | Admitting: Family Medicine

## 2020-06-15 ENCOUNTER — Other Ambulatory Visit: Payer: Self-pay

## 2020-06-15 DIAGNOSIS — Z9049 Acquired absence of other specified parts of digestive tract: Secondary | ICD-10-CM | POA: Diagnosis not present

## 2020-06-15 DIAGNOSIS — J019 Acute sinusitis, unspecified: Secondary | ICD-10-CM | POA: Insufficient documentation

## 2020-06-15 DIAGNOSIS — Z880 Allergy status to penicillin: Secondary | ICD-10-CM | POA: Insufficient documentation

## 2020-06-15 DIAGNOSIS — U071 COVID-19: Secondary | ICD-10-CM | POA: Diagnosis not present

## 2020-06-15 DIAGNOSIS — Z79890 Hormone replacement therapy (postmenopausal): Secondary | ICD-10-CM | POA: Diagnosis not present

## 2020-06-15 DIAGNOSIS — Z9079 Acquired absence of other genital organ(s): Secondary | ICD-10-CM | POA: Diagnosis not present

## 2020-06-15 DIAGNOSIS — R051 Acute cough: Secondary | ICD-10-CM | POA: Diagnosis present

## 2020-06-15 LAB — SARS CORONAVIRUS 2 (TAT 6-24 HRS): SARS Coronavirus 2: POSITIVE — AB

## 2020-06-15 MED ORDER — DOXYCYCLINE HYCLATE 100 MG PO CAPS: 100.0000 mg | ORAL_CAPSULE | Freq: Two times a day (BID) | ORAL | 0 refills | Status: DC

## 2020-06-15 NOTE — ED Triage Notes (Signed)
Pt c/o cough, sore throat, sinus congestion since Saturday. Pt has been taking OTC sinus meds with some improvement. Pt denies f/n/v/d or other symptoms.

## 2020-06-15 NOTE — ED Provider Notes (Signed)
[facility]    CSN: [id number] Arrival date & time: 06/15/20  2458      History   Chief Complaint Chief Complaint  Patient presents with  . Cough  . Nasal Congestion   HPI  55 year old female presents with the above complaints.  Patient states that she has been sick since Saturday.  She reports cough, sore throat, sinus congestion/nasal congestion.  She has been taking over-the-counter medication without resolution.  No documented fever.  No reported sick contacts.  No known exacerbating factors.  No other complaints or concerns at this time.  Past Medical History:  Diagnosis Date  . Arthritis    BIL KNEES  . Calculus of gallbladder with acute and chronic cholecystitis without obstruction   . Complication of anesthesia   . Family history of adverse reaction to anesthesia    PTS DAD IS HARD TO WAKE UP-PATERNAL GRANDMOTHER-VERY SENSITIVE TO ANESTHESIA  . GERD (gastroesophageal reflux disease)   . PONV (postoperative nausea and vomiting)    NORMALLY VOMITS ONCE AFTER ANESTHESIA AND IS FINE AFTER THAT    Patient Active Problem List   Diagnosis Date Noted  . Abnormal uterine bleeding (AUB) 06/03/2017  . History of fracture 06/03/2017  . Menorrhagia with irregular cycle 06/03/2017  . Postoperative state 03/21/2017    Past Surgical History:  Procedure Laterality Date  . ABDOMINAL HYSTERECTOMY    . BREAST CYST ASPIRATION Left 04/25/2009   U/S cyst asp- neg  . CESAREAN SECTION    . CHOLECYSTECTOMY N/A 06/27/2017   Procedure: LAPAROSCOPIC CHOLECYSTECTOMY;  Surgeon: Vickie Epley, MD;  Location: [facility] ORS;  Service: General;  Laterality: N/A;  . COLONOSCOPY    . COLONOSCOPY N/A 05/22/2020   Procedure: COLONOSCOPY;  Surgeon: Lesly Rubenstein, MD;  Location: [facility] ENDOSCOPY;  Service: Endoscopy;  Laterality: N/A;  . ENDOMETRIAL ABLATION    . ESOPHAGOGASTRODUODENOSCOPY    . ESOPHAGOGASTRODUODENOSCOPY N/A 05/22/2020   Procedure: ESOPHAGOGASTRODUODENOSCOPY (EGD);   Surgeon: Lesly Rubenstein, MD;  Location: [facility] ENDOSCOPY;  Service: Endoscopy;  Laterality: N/A;  . EXCISION OF ENDOMETRIOMA  03/21/2017   Procedure: EXCISION OF ENDOMETRIOSIS IMPLANTS;  Surgeon: Ouida Sills Gwen Her, MD;  Location: [facility] ORS;  Service: Gynecology;;  . HEMORRHOID SURGERY N/A 12/08/2014   Procedure: HEMORRHOIDECTOMY;  Surgeon: Leonie Green, MD;  Location: [facility] ORS;  Service: General;  Laterality: N/A;  . LAPAROSCOPIC BILATERAL SALPINGECTOMY Bilateral 03/21/2017   Procedure: LAPAROSCOPIC BILATERAL SALPINGECTOMY;  Surgeon: Boykin Nearing, MD;  Location: [facility] ORS;  Service: Gynecology;  Laterality: Bilateral;  . LAPAROSCOPIC SUPRACERVICAL HYSTERECTOMY N/A 03/21/2017   Procedure: LAPAROSCOPIC SUPRACERVICAL HYSTERECTOMY;  Surgeon: Schermerhorn, Gwen Her, MD;  Location: [facility] ORS;  Service: Gynecology;  Laterality: N/A;  . ORIF PATELLA    . PATELLAR TENDON REPAIR    . TUBAL LIGATION      OB History   No obstetric history on file.      Home Medications    Prior to Admission medications   Medication Sig Start Date End Date Taking? Authorizing Provider  doxycycline (VIBRAMYCIN) 100 MG capsule Take 1 capsule (100 mg total) by mouth 2 (two) times daily. 06/15/20  Yes Tywone Bembenek G, DO  ibuprofen (ADVIL,MOTRIN) 200 MG tablet Take 200 mg by mouth every 8 (eight) hours as needed for mild pain or moderate pain.   Yes [provider]  levothyroxine (SYNTHROID) 100 MCG tablet Take 100 mcg by mouth daily. 05/25/20  Yes [provider]  Multiple Vitamin (MULTIVITAMIN WITH MINERALS) TABS tablet Take 1 tablet  by mouth daily.   Yes [provider]  naproxen sodium (ALEVE) 220 MG tablet Take 220 mg by mouth daily as needed (pain).    Yes [provider]  pantoprazole (PROTONIX) 40 MG tablet Take 40 mg by mouth 2 (two) times daily.   Yes [provider]  omeprazole (PRILOSEC) 20 MG capsule Take 20 mg by mouth every morning.   06/15/20   [provider]    Family History Family History  Problem Relation Age of Onset  . Hypertension Mother   . Hyperlipidemia Mother   . Hypertension Father   . Hyperlipidemia Father   . Bone cancer Maternal Aunt   . Stomach cancer Maternal Grandmother   . Leukemia Paternal Grandfather   . Breast cancer Neg Hx     Social History Social History   Tobacco Use  . Smoking status: Never Smoker  . Smokeless tobacco: Never Used  Vaping Use  . Vaping Use: Never used  Substance Use Topics  . Alcohol use: No  . Drug use: No     Allergies   Amoxicillin   Review of Systems Review of Systems Per HPI  Physical Exam Triage Vital Signs ED Triage Vitals  Enc Vitals Group     BP 06/15/20 0911 (!) 145/91     Pulse Rate 06/15/20 0911 86     Resp 06/15/20 0911 18     Temp 06/15/20 0911 99.2 F (37.3 C)     Temp Source 06/15/20 0911 Oral     SpO2 06/15/20 0911 100 %     Weight 06/15/20 0907 190 lb (86.2 kg)     Height 06/15/20 0907 5\' 3"  (1.6 m)     Head Circumference --      Peak Flow --      Pain Score 06/15/20 0907 0     Pain Loc --      Pain Edu? --      Excl. in [place]? --    Updated Vital Signs BP (!) 145/91 (BP Location: Left Arm)   Pulse 86   Temp 99.2 F (37.3 C) (Oral)   Resp 18   Ht 5\' 3"  (1.6 m)   Wt 86.2 kg   LMP 03/13/2017   SpO2 100%   BMI 33.66 kg/m   Visual Acuity Right Eye Distance:   Left Eye Distance:   Bilateral Distance:    Right Eye Near:   Left Eye Near:    Bilateral Near:     Physical Exam Vitals and nursing note reviewed.  Constitutional:      General: She is not in acute distress.    Appearance: Normal appearance. She is not ill-appearing.  HENT:     Head: Normocephalic and atraumatic.     Nose:     Comments: Maxillary sinus tenderness to palpation. Eyes:     General:        Right eye: No discharge.        Left eye: No discharge.     Conjunctiva/sclera: Conjunctivae normal.  Cardiovascular:     Rate and Rhythm:  Normal rate and regular rhythm.     Heart sounds: No murmur heard.   Pulmonary:     Effort: Pulmonary effort is normal.     Breath sounds: Normal breath sounds. No wheezing, rhonchi or rales.  Neurological:     Mental Status: She is alert.  Psychiatric:        Mood and Affect: Mood normal.  Behavior: Behavior normal.    UC Treatments / Results  Labs (all labs ordered are listed, but only abnormal results are displayed) Labs Reviewed  SARS CORONAVIRUS 2 (TAT 6-24 HRS)    EKG   Radiology No results found.  Procedures Procedures (including critical care time)  Medications Ordered in UC Medications - No data to display  Initial Impression / Assessment and Plan / UC Course  I have reviewed the triage vital signs and the nursing notes.  Pertinent labs & imaging results that were available during my care of the patient were reviewed by me and considered in my medical decision making (see chart for details).    56 year old female presents with sinusitis.  Awaiting Covid test results.  Placing on doxycycline.  Final Clinical Impressions(s) / UC Diagnoses   Final diagnoses:  Acute sinusitis, recurrence not specified, unspecified location     Discharge Instructions     Medication as prescribed.  Stay home.  Check my chart for COVID test results.  Take care  Dr. Lacinda Axon     ED Prescriptions    Medication Sig Dispense Auth. Provider   doxycycline (VIBRAMYCIN) 100 MG capsule Take 1 capsule (100 mg total) by mouth 2 (two) times daily. 20 capsule Coral Spikes, DO     PDMP not reviewed this encounter.   Coral Spikes, [place] 06/15/20 1154

## 2020-06-15 NOTE — Discharge Instructions (Signed)
Medication as prescribed.  Stay home.  Check my chart for COVID test results.  Take care  Dr. Lalani Winkles   

## 2020-06-16 ENCOUNTER — Telehealth: Payer: Self-pay | Admitting: Family

## 2020-06-16 NOTE — Telephone Encounter (Signed)
Called to discuss with patient about COVID-19 symptoms and the use of one of the available treatments for those with mild to moderate Covid symptoms and at a high risk of hospitalization.  Pt appears to qualify for outpatient treatment due to co-morbid conditions and/or a member of an at-risk group in accordance with the FDA Emergency Use Authorization.    Symptom onset: 06/10/20 Vaccinated:  Booster?  Immunocompromised? Qualifiers: BMI >25  I spoke with Monique Brock and she is not currently interested in learning more about or receiving treatment at this time. Post-Covid Care Clinic information sent through [facility] per her request.   Terri Piedra, NP 06/16/2020 1:44 PM

## 2020-07-05 ENCOUNTER — Other Ambulatory Visit: Payer: Self-pay | Admitting: *Deleted

## 2020-07-05 ENCOUNTER — Other Ambulatory Visit: Payer: Self-pay | Admitting: Obstetrics and Gynecology

## 2020-07-05 ENCOUNTER — Other Ambulatory Visit: Payer: Self-pay | Admitting: Orthopedic Surgery

## 2020-07-05 DIAGNOSIS — Z1231 Encounter for screening mammogram for malignant neoplasm of breast: Secondary | ICD-10-CM

## 2020-07-20 ENCOUNTER — Other Ambulatory Visit: Payer: Self-pay

## 2020-07-20 ENCOUNTER — Ambulatory Visit
Admission: RE | Admit: 2020-07-20 | Discharge: 2020-07-20 | Disposition: A | Discharge status: Home / Self Care | Payer: BC Managed Care – PPO | Source: Ambulatory Visit | Attending: Obstetrics and Gynecology | Admitting: Obstetrics and Gynecology

## 2020-07-20 DIAGNOSIS — Z1231 Encounter for screening mammogram for malignant neoplasm of breast: Secondary | ICD-10-CM | POA: Insufficient documentation

## 2020-12-25 ENCOUNTER — Ambulatory Visit
Admission: EM | Admit: 2020-12-25 | Discharge: 2020-12-25 | Disposition: A | Discharge status: Home / Self Care | Payer: BC Managed Care – PPO | Attending: Physician Assistant | Admitting: Physician Assistant

## 2020-12-25 ENCOUNTER — Encounter: Payer: Self-pay | Admitting: Emergency Medicine

## 2020-12-25 ENCOUNTER — Other Ambulatory Visit: Payer: Self-pay

## 2020-12-25 ENCOUNTER — Ambulatory Visit (INDEPENDENT_AMBULATORY_CARE_PROVIDER_SITE_OTHER): Payer: BC Managed Care – PPO

## 2020-12-25 DIAGNOSIS — S46001A Unspecified injury of muscle(s) and tendon(s) of the rotator cuff of right shoulder, initial encounter: Secondary | ICD-10-CM | POA: Diagnosis not present

## 2020-12-25 DIAGNOSIS — M79621 Pain in right upper arm: Secondary | ICD-10-CM

## 2020-12-25 DIAGNOSIS — M25511 Pain in right shoulder: Secondary | ICD-10-CM

## 2020-12-25 MED ORDER — BACLOFEN 10 MG PO TABS: 10.0000 mg | ORAL_TABLET | Freq: Three times a day (TID) | ORAL | 0 refills | Status: DC | PRN

## 2020-12-25 MED ORDER — DICLOFENAC SODIUM 75 MG PO TBEC: 75.0000 mg | DELAYED_RELEASE_TABLET | Freq: Two times a day (BID) | ORAL | 0 refills | Status: AC

## 2020-12-25 NOTE — ED Provider Notes (Signed)
[facility]    CSN: [id number] Arrival date & time: 12/25/20  1111      History   Chief Complaint Chief Complaint  Patient presents with   Shoulder Pain    right   Arm Pain    right    HPI Monique Brock is a 55 y.o. female presenting for ~3 week history of right shoulder pain.  Patient states she was reaching behind herself to get something out of the backseat when she felt a pop in her shoulder.  She says she has had pain ever since then.  Patient also admits to limited range of motion of the shoulder.  She states she feels pain of the lateral deltoid and around the trapezius region.  She states that if she holds her arm up with her elbow flexed she will start to feel numbness and tingling in her forearm and all of her fingers.  She says she has taken ibuprofen and her husband has given her massages and she has had a little bit of improvement in her range of motion but still has limited range of motion beyond 90 degrees flexion and abduction.  She states she is right-handed.  She denies any previous injuries of the shoulder.  She denies any pain in her back or chest.  She has no other complaints or concerns.  HPI  Past Medical History:  Diagnosis Date   Arthritis    BIL KNEES   Calculus of gallbladder with acute and chronic cholecystitis without obstruction    Complication of anesthesia    Family history of adverse reaction to anesthesia    PTS DAD IS HARD TO WAKE UP-PATERNAL GRANDMOTHER-VERY SENSITIVE TO ANESTHESIA   GERD (gastroesophageal reflux disease)    PONV (postoperative nausea and vomiting)    NORMALLY VOMITS ONCE AFTER ANESTHESIA AND IS FINE AFTER THAT    Patient Active Problem List   Diagnosis Date Noted   Abnormal uterine bleeding (AUB) 06/03/2017   History of fracture 06/03/2017   Menorrhagia with irregular cycle 06/03/2017   Postoperative state 03/21/2017    Past Surgical History:  Procedure Laterality Date   ABDOMINAL HYSTERECTOMY      BREAST CYST ASPIRATION Left 04/25/2009   U/S cyst asp- neg   CESAREAN SECTION     CHOLECYSTECTOMY N/A 06/27/2017   Procedure: LAPAROSCOPIC CHOLECYSTECTOMY;  Surgeon: Vickie Epley, MD;  Location: [place];  Service: General;  Laterality: N/A;   COLONOSCOPY     COLONOSCOPY N/A 05/22/2020   Procedure: COLONOSCOPY;  Surgeon: Lesly Rubenstein, MD;  Location: [facility] ENDOSCOPY;  Service: Endoscopy;  Laterality: N/A;   ENDOMETRIAL ABLATION     ESOPHAGOGASTRODUODENOSCOPY     ESOPHAGOGASTRODUODENOSCOPY N/A 05/22/2020   Procedure: ESOPHAGOGASTRODUODENOSCOPY (EGD);  Surgeon: Lesly Rubenstein, MD;  Location: [facility] ENDOSCOPY;  Service: Endoscopy;  Laterality: N/A;   EXCISION OF ENDOMETRIOMA  03/21/2017   Procedure: EXCISION OF ENDOMETRIOSIS IMPLANTS;  Surgeon: Ouida Sills Gwen Her, MD;  Location: [facility] ORS;  Service: Gynecology;;   HEMORRHOID SURGERY N/A 12/08/2014   Procedure: HEMORRHOIDECTOMY;  Surgeon: Leonie Green, MD;  Location: [facility] ORS;  Service: General;  Laterality: N/A;   LAPAROSCOPIC BILATERAL SALPINGECTOMY Bilateral 03/21/2017   Procedure: LAPAROSCOPIC BILATERAL SALPINGECTOMY;  Surgeon: Boykin Nearing, MD;  Location: [facility] ORS;  Service: Gynecology;  Laterality: Bilateral;   LAPAROSCOPIC SUPRACERVICAL HYSTERECTOMY N/A 03/21/2017   Procedure: LAPAROSCOPIC SUPRACERVICAL HYSTERECTOMY;  Surgeon: Schermerhorn, Gwen Her, MD;  Location: [facility] ORS;  Service: Gynecology;  Laterality: N/A;   ORIF PATELLA  PATELLAR TENDON REPAIR     TUBAL LIGATION      OB History   No obstetric history on file.      Home Medications    Prior to Admission medications   Medication Sig Start Date End Date Taking? Authorizing Provider  baclofen (LIORESAL) 10 MG tablet Take 1 tablet (10 mg total) by mouth 3 (three) times daily as needed for muscle spasms. 12/25/20  Yes Danton Clap, PA-C  diclofenac (VOLTAREN) 75 MG EC tablet Take 1 tablet (75 mg total) by mouth 2 (two) times daily for 15 days.  12/25/20 01/09/21 Yes Danton Clap, PA-C  levothyroxine (SYNTHROID) 100 MCG tablet Take 100 mcg by mouth daily. 05/25/20  Yes [provider]  Multiple Vitamin (MULTIVITAMIN WITH MINERALS) TABS tablet Take 1 tablet by mouth daily.   Yes [provider]  pantoprazole (PROTONIX) 40 MG tablet Take 40 mg by mouth 2 (two) times daily.   Yes [provider]  omeprazole (PRILOSEC) 20 MG capsule Take 20 mg by mouth every morning.   06/15/20  [provider]    Family History Family History  Problem Relation Age of Onset   Hypertension Mother    Hyperlipidemia Mother    Hypertension Father    Hyperlipidemia Father    Bone cancer Maternal Aunt    Stomach cancer Maternal Grandmother    Leukemia Paternal Grandfather    Breast cancer Neg Hx     Social History Social History   Tobacco Use   Smoking status: Never   Smokeless tobacco: Never  Vaping Use   Vaping Use: Never used  Substance Use Topics   Alcohol use: No   Drug use: No     Allergies   Amoxicillin   Review of Systems Review of Systems  Cardiovascular:  Negative for chest pain.  Musculoskeletal:  Positive for arthralgias. Negative for back pain, joint swelling and neck pain.  Skin:  Negative for rash and wound.  Neurological:  Negative for weakness and numbness.    Physical Exam Triage Vital Signs ED Triage Vitals  Enc Vitals Group     BP 12/25/20 1134 140/88     Pulse Rate 12/25/20 1134 73     Resp 12/25/20 1134 18     Temp 12/25/20 1134 98.3 F (36.8 C)     Temp Source 12/25/20 1134 Oral     SpO2 12/25/20 1134 100 %     Weight 12/25/20 1135 190 lb 0.6 oz (86.2 kg)     Height 12/25/20 1135 '5\' 3"'$  (1.6 m)     Head Circumference --      Peak Flow --      Pain Score 12/25/20 1134 4     Pain Loc --      Pain Edu? --      Excl. in [place]? --    No data found.  Updated Vital Signs BP 140/88 (BP Location: Left Arm)   Pulse 73   Temp 98.3 F (36.8 C) (Oral)   Resp 18   Ht '5\' 3"'$   (1.6 m)   Wt 190 lb 0.6 oz (86.2 kg)   LMP 03/13/2017   SpO2 100%   BMI 33.66 kg/m   Physical Exam Vitals and nursing note reviewed.  Constitutional:      General: She is not in acute distress.    Appearance: Normal appearance. She is not ill-appearing or toxic-appearing.  HENT:     Head: Normocephalic and atraumatic.  Eyes:  General: No scleral icterus.       Right eye: No discharge.        Left eye: No discharge.     Conjunctiva/sclera: Conjunctivae normal.  Cardiovascular:     Rate and Rhythm: Normal rate and regular rhythm.     Heart sounds: Normal heart sounds.  Pulmonary:     Effort: Pulmonary effort is normal. No respiratory distress.     Breath sounds: Normal breath sounds.  Musculoskeletal:     Right shoulder: Tenderness (TTP right trapezius with muscle spasms) present. No swelling or deformity. Decreased range of motion (decreased abduction and flexion beyond 90 degrees). Normal strength.     Cervical back: Neck supple.     Comments: + supraspinatus testing. Increased pain with external rotation at elbow  Skin:    General: Skin is dry.  Neurological:     General: No focal deficit present.     Mental Status: She is alert. Mental status is at baseline.     Motor: No weakness.     Gait: Gait normal.  Psychiatric:        Mood and Affect: Mood normal.        Behavior: Behavior normal.        Thought Content: Thought content normal.     UC Treatments / Results  Labs (all labs ordered are listed, but only abnormal results are displayed) Labs Reviewed - No data to display  EKG   Radiology DG Shoulder Right  Result Date: 12/25/2020 CLINICAL DATA:  Right upper arm pain and shoulder pain, popping sensation, initial encounter. EXAM: RIGHT SHOULDER - 2+ VIEW COMPARISON:  None. FINDINGS: No acute osseous or joint abnormality. No degenerative changes. Visualized portion of the right chest is unremarkable. IMPRESSION: Negative. Electronically Signed   By: Lorin Picket M.D.   On: 12/25/2020 12:30    Procedures Procedures (including critical care time)  Medications Ordered in UC Medications - No data to display  Initial Impression / Assessment and Plan / UC Course  I have reviewed the triage vital signs and the nursing notes.  Pertinent labs & imaging results that were available during my care of the patient were reviewed by me and considered in my medical decision making (see chart for details).  55 year old female presenting for right shoulder pain for the past 3 weeks.  She did injure the shoulder when she was reaching behind herself with her shoulder rotated.  She does have limited range of motion with flexion and abduction.   X-ray of shoulder obtained, independently viewed by me, and normal.   I have high suspicion for rotator cuff sprain/strain but cannot rule out a least a partial tear given her limited range of motion.  Supportive care advised at this time the following RICE guidelines.  I have also sent in diclofenac sodium and baclofen.  Advised heat on her trapezius.  Advised to avoid any overhead reaching or lifting.  I did explain to patient that she will need to follow-up with orthopedics as she may need an MRI since this is been ongoing for 3 weeks and not improving and an MRI was diagnosed there is a actual tear of her rotator cuff which might require physical therapy versus surgery.  Patient agrees with plan.   Final Clinical Impressions(s) / UC Diagnoses   Final diagnoses:  Injury of muscle or tendon of right rotator cuff  Acute pain of right shoulder     Discharge Instructions      ROTATOR CUFF  INJURY: Your x-ray was normal but I am concerned he could have injured your rotator cuff.  This could be just inflammation due to a strain or sprain or a tear.  To be able to diagnose that for certain you would need to have an MRI.  Stressed avoiding painful activities . Reviewed RICE guidelines. Use medications as directed, including  NSAIDs. If no NSAIDs have been prescribed for you today, you may take Aleve or Motrin over the counter. May use Tylenol in between doses of NSAIDs.  If no improvement in the next 1-2 weeks, f/u with PCP or return to our office for reexamination, and please feel free to call or return at any time for any questions or concerns you may have and we will be happy to help you!      You have a condition requiring you to follow up with Orthopedics so please call one of the following office for appointment:   [facility] [address] [place], [place] [address] Phone: [phone]  [facility] [address], [place],  [address] Phone: [phone]      ED Prescriptions     Medication Sig Dispense Auth. Provider   diclofenac (VOLTAREN) 75 MG EC tablet Take 1 tablet (75 mg total) by mouth 2 (two) times daily for 15 days. 30 tablet Laurene Footman B, PA-C   baclofen (LIORESAL) 10 MG tablet Take 1 tablet (10 mg total) by mouth 3 (three) times daily as needed for muscle spasms. 30 each Danton Clap, PA-C      I have reviewed the PDMP during this encounter.   Danton Clap, PA-C 12/25/20 1310

## 2020-12-25 NOTE — ED Triage Notes (Signed)
Pt c/o right upper arm and shoulder pain. Started about 3-4 weeks ago. She states she felt it pull and pop when she reached in the back seat of her car to get something. She is able to move her shoulder but has decreased ROM.

## 2020-12-25 NOTE — Discharge Instructions (Addendum)
ROTATOR CUFF INJURY: Your x-ray was normal but I am concerned he could have injured your rotator cuff.  This could be just inflammation due to a strain or sprain or a tear.  To be able to diagnose that for certain you would need to have an MRI.  Stressed avoiding painful activities . Reviewed RICE guidelines. Use medications as directed, including NSAIDs. If no NSAIDs have been prescribed for you today, you may take Aleve or Motrin over the counter. May use Tylenol in between doses of NSAIDs.  If no improvement in the next 1-2 weeks, f/u with PCP or return to our office for reexamination, and please feel free to call or return at any time for any questions or concerns you may have and we will be happy to help you!      You have a condition requiring you to follow up with Orthopedics so please call one of the following office for appointment:   [facility] [address] [place], [place] [address] Phone: [phone]  [facility] [address] [place], [place] [address] Phone: [phone]

## 2021-03-12 ENCOUNTER — Other Ambulatory Visit: Payer: Self-pay | Admitting: Orthopedic Surgery

## 2021-03-12 DIAGNOSIS — M25511 Pain in right shoulder: Secondary | ICD-10-CM

## 2021-03-12 DIAGNOSIS — S46001D Unspecified injury of muscle(s) and tendon(s) of the rotator cuff of right shoulder, subsequent encounter: Secondary | ICD-10-CM

## 2021-04-13 DIAGNOSIS — K2100 Gastro-esophageal reflux disease with esophagitis, without bleeding: Secondary | ICD-10-CM | POA: Insufficient documentation

## 2021-04-13 DIAGNOSIS — E039 Hypothyroidism, unspecified: Secondary | ICD-10-CM | POA: Insufficient documentation

## 2022-04-21 DIAGNOSIS — E7800 Pure hypercholesterolemia, unspecified: Secondary | ICD-10-CM | POA: Insufficient documentation

## 2022-10-25 IMAGING — MG MM DIGITAL SCREENING BILAT W/ TOMO AND CAD
8 series · 8 of 24 positions shown · non-contrast
Comparison: Previous exam(s).

CLINICAL DATA: Screening.

EXAM:
DIGITAL SCREENING BILATERAL MAMMOGRAM WITH TOMOSYNTHESIS AND CAD
TECHNIQUE: Bilateral screening digital craniocaudal and mediolateral oblique
mammograms were obtained. Bilateral screening digital breast
tomosynthesis was performed. The images were evaluated with
computer-aided detection.

[L MLO synth-2D]
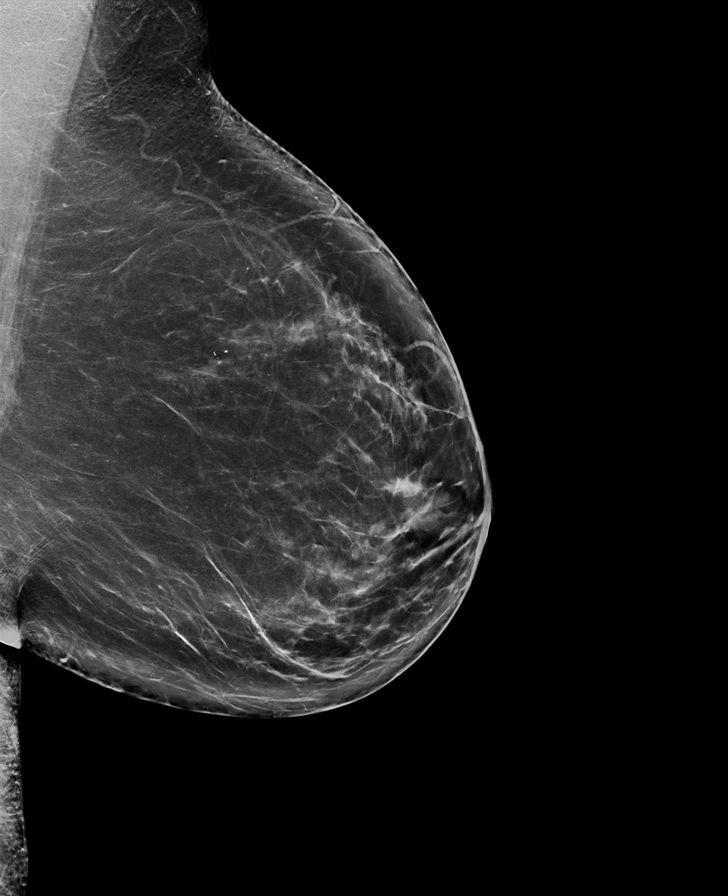

[R CC synth-2D]
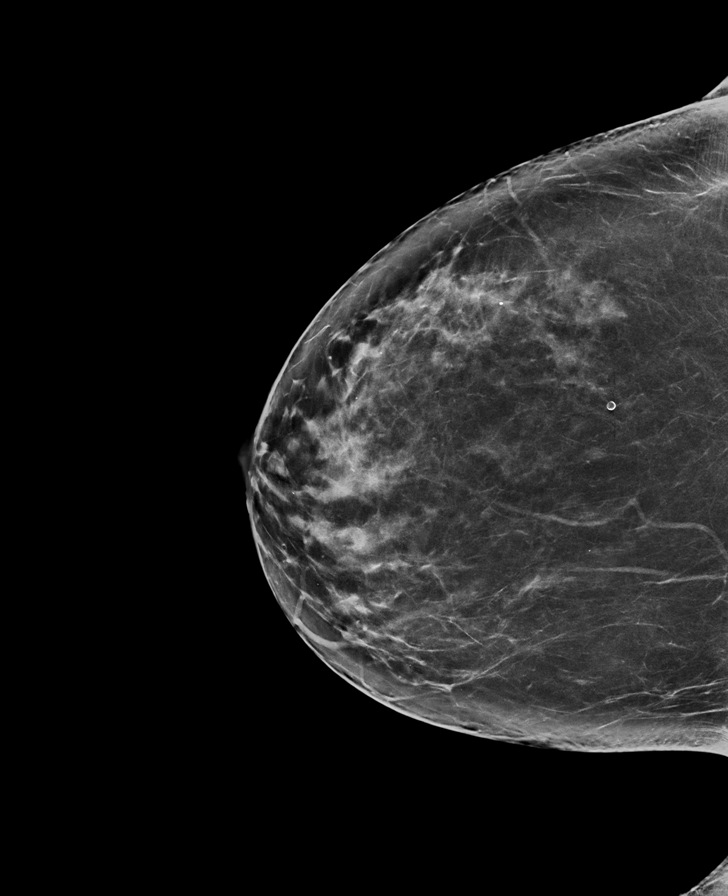

[L CC synth-2D]
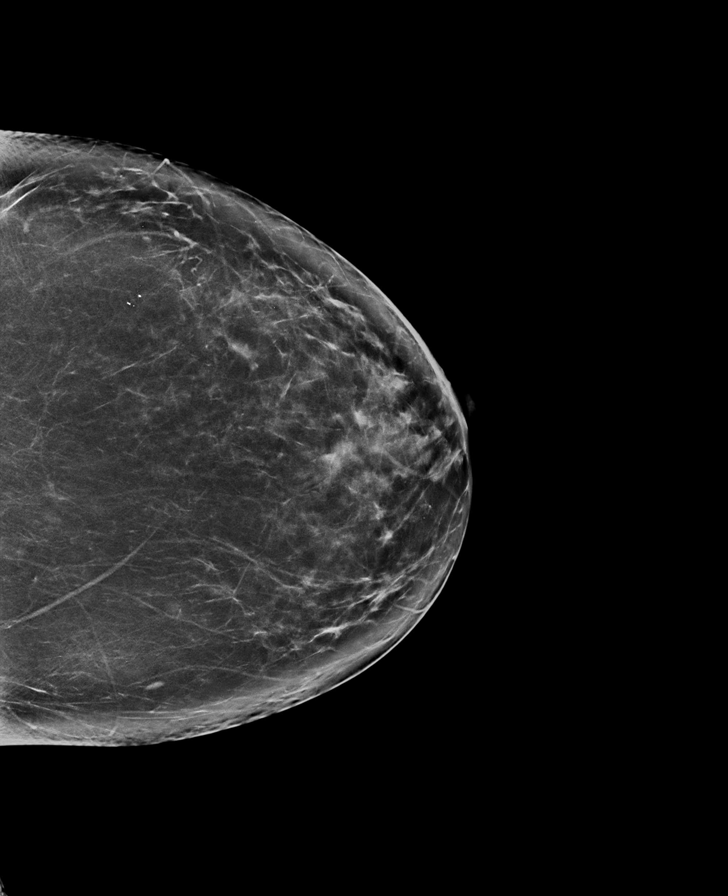

[R MLO synth-2D]
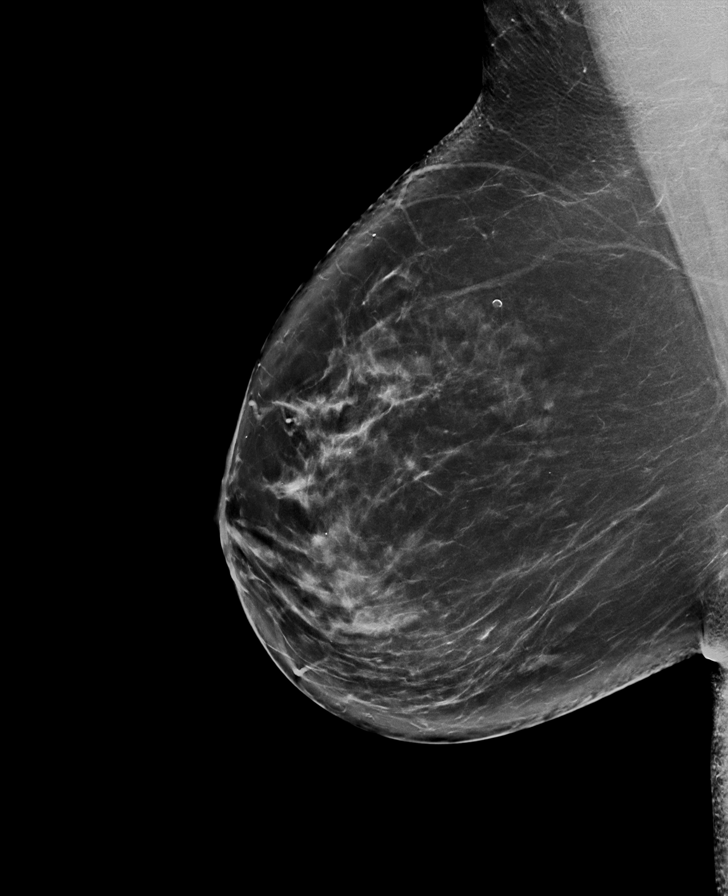

[L CC tomo · tomo slice 41/81.0]
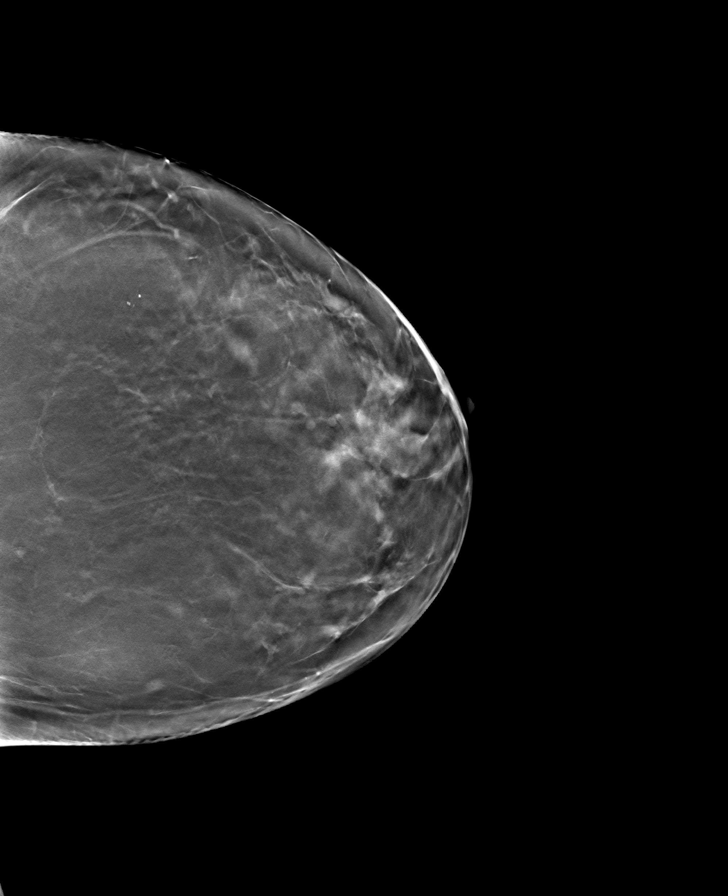

[L MLO tomo · tomo slice 44/87.0]
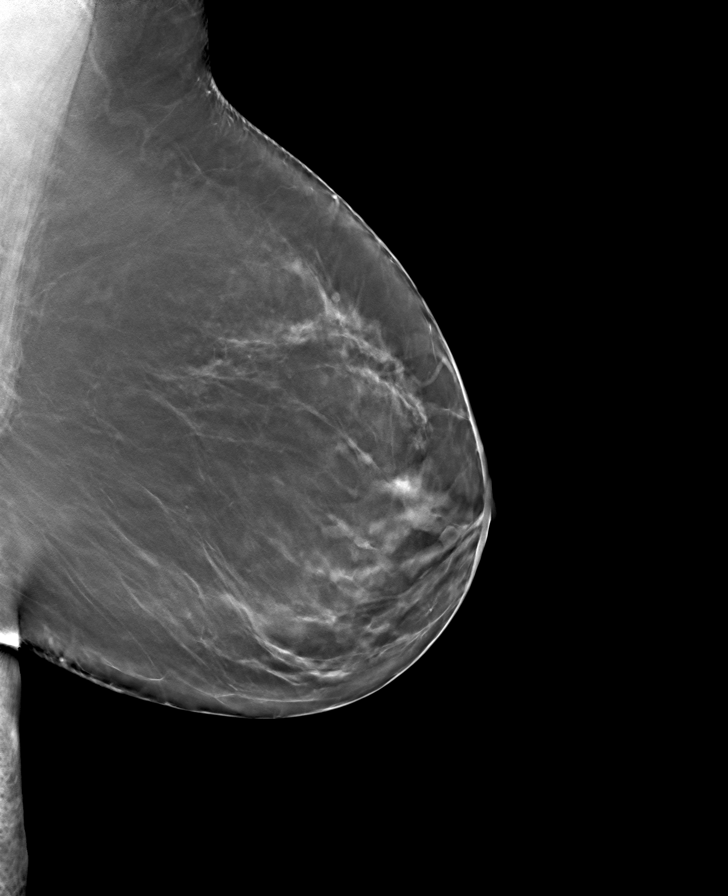

[R MLO tomo · tomo slice 46/91.0]
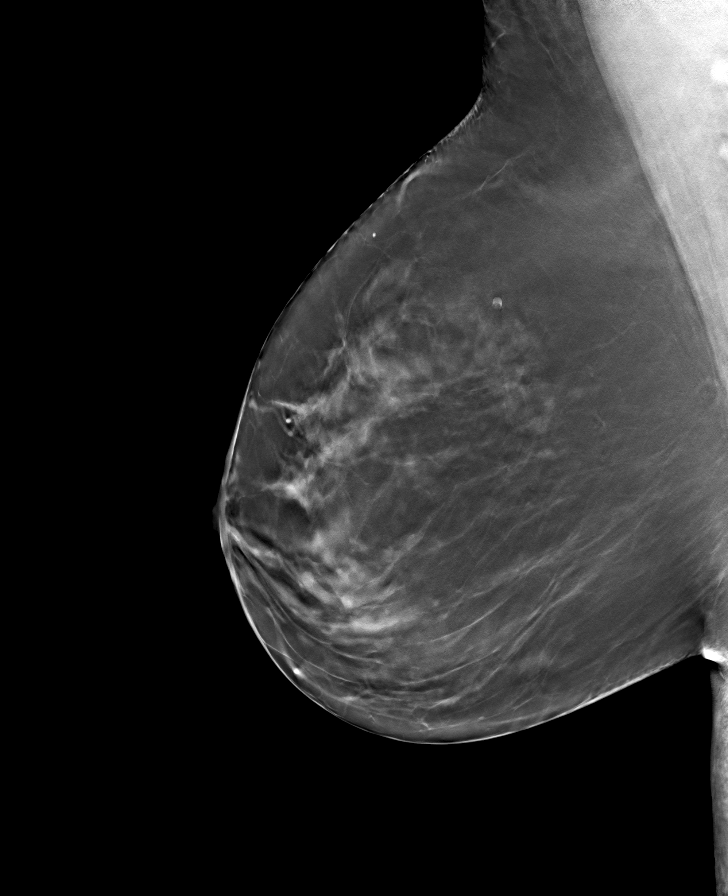

[R CC tomo · tomo slice 41/81.0]
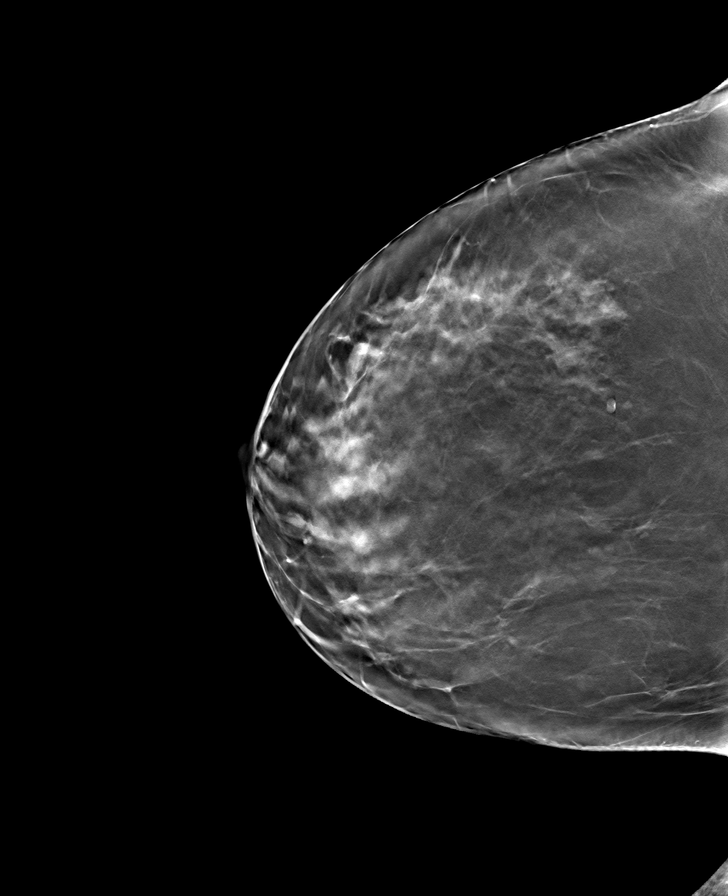

[8 of 24 positions shown; findings below may reference images not displayed]

ACR Breast Density Category b: There are scattered areas of
fibroglandular density.
FINDINGS: There are no findings suspicious for malignancy. The images were
evaluated with computer-aided detection.
IMPRESSION: No mammographic evidence of malignancy. A result letter of this
screening mammogram will be mailed directly to the patient.

RECOMMENDATION:
Screening mammogram in one year. (Code:[id number]-[id number])

BI-RADS CATEGORY  1: Negative.

## 2023-02-21 ENCOUNTER — Other Ambulatory Visit: Payer: Self-pay | Admitting: Obstetrics and Gynecology

## 2023-02-21 DIAGNOSIS — Z1231 Encounter for screening mammogram for malignant neoplasm of breast: Secondary | ICD-10-CM

## 2023-03-19 ENCOUNTER — Ambulatory Visit
Admission: RE | Admit: 2023-03-19 | Discharge: 2023-03-19 | Disposition: A | Discharge status: Home / Self Care | Payer: BC Managed Care – PPO | Source: Ambulatory Visit | Attending: Obstetrics and Gynecology | Admitting: Obstetrics and Gynecology

## 2023-03-19 DIAGNOSIS — Z1231 Encounter for screening mammogram for malignant neoplasm of breast: Secondary | ICD-10-CM | POA: Diagnosis present

## 2023-04-01 IMAGING — CR DG SHOULDER 2+V*R*
3 series · 3 of 3 positions shown · non-contrast
Comparison: None.

CLINICAL DATA: Right upper arm pain and shoulder pain, popping
sensation, initial encounter.

EXAM:
RIGHT SHOULDER - 2+ VIEW

[shoulder grashey]
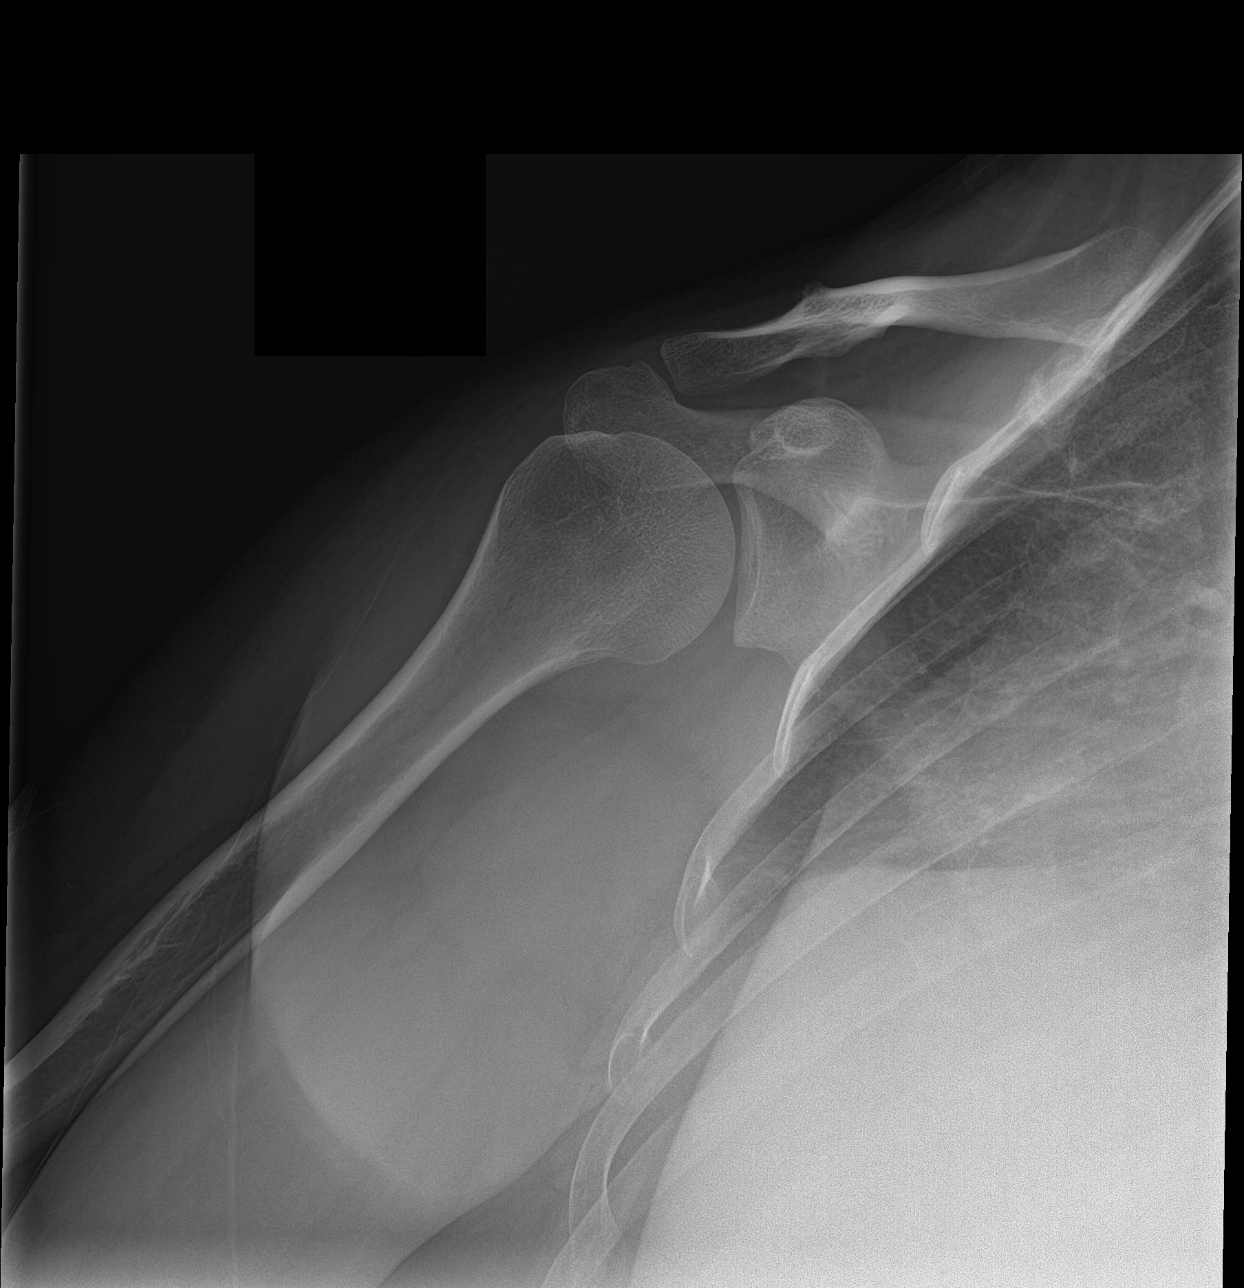

[shoulder y view]
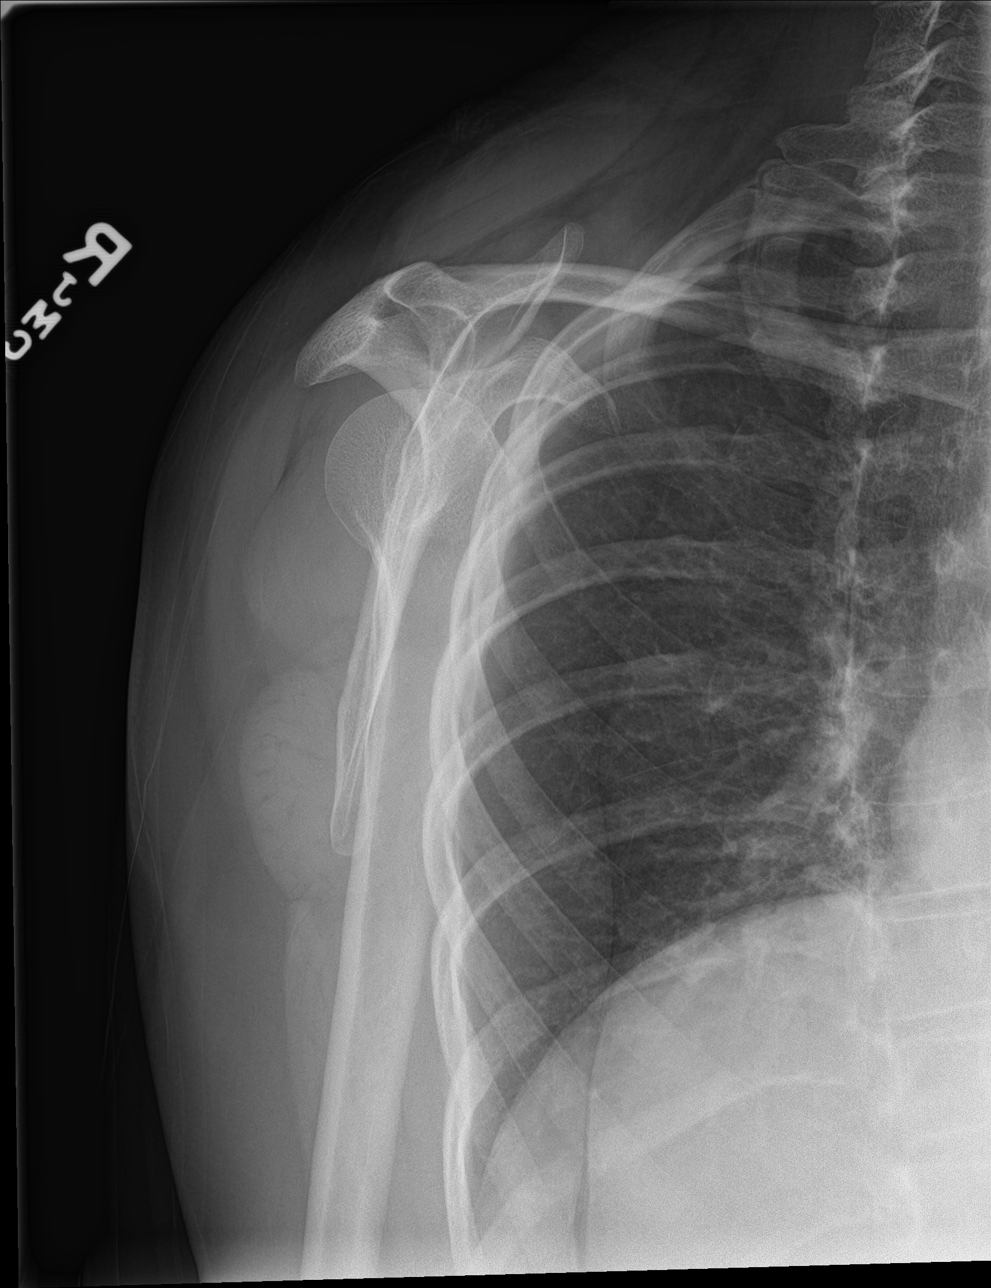

[shoulder axial]
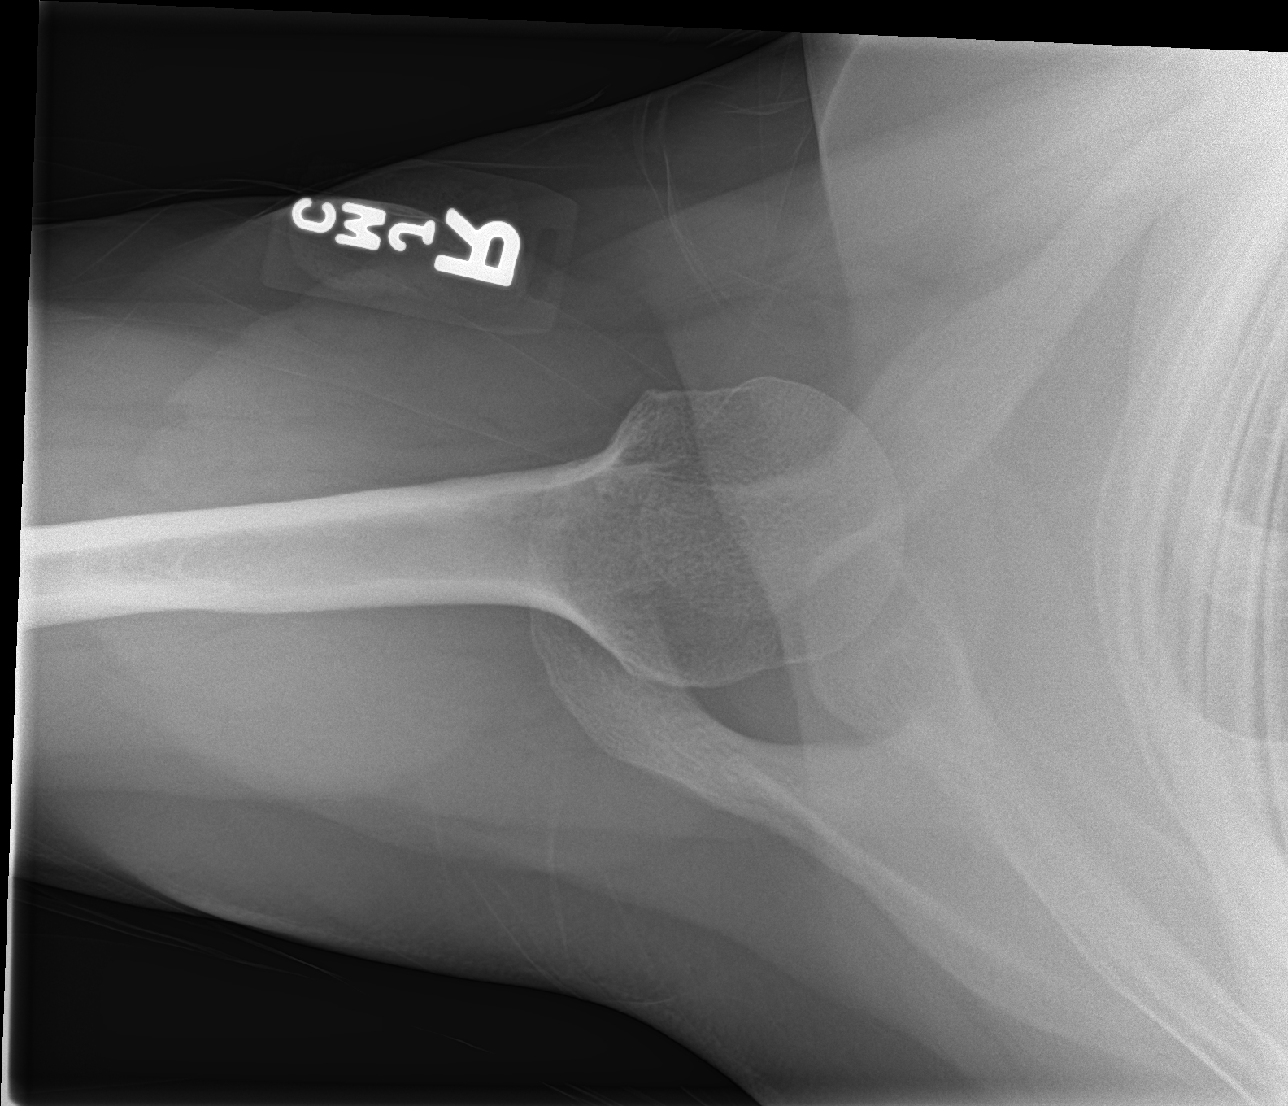

[3 of 3 positions shown; findings below may reference images not displayed]

FINDINGS: No acute osseous or joint abnormality. No degenerative changes.
Visualized portion of the right chest is unremarkable.
IMPRESSION: Negative.

## 2023-05-12 DIAGNOSIS — G4733 Obstructive sleep apnea (adult) (pediatric): Secondary | ICD-10-CM | POA: Insufficient documentation

## 2023-09-01 DIAGNOSIS — B009 Herpesviral infection, unspecified: Secondary | ICD-10-CM | POA: Insufficient documentation

## 2024-02-03 ENCOUNTER — Other Ambulatory Visit: Payer: Self-pay | Admitting: Obstetrics and Gynecology

## 2024-02-03 DIAGNOSIS — Z1231 Encounter for screening mammogram for malignant neoplasm of breast: Secondary | ICD-10-CM

## 2024-03-23 ENCOUNTER — Ambulatory Visit
Admission: RE | Admit: 2024-03-23 | Discharge: 2024-03-23 | Disposition: A | Discharge status: Home / Self Care | Source: Ambulatory Visit | Attending: Obstetrics and Gynecology | Admitting: Obstetrics and Gynecology

## 2024-03-23 DIAGNOSIS — Z1231 Encounter for screening mammogram for malignant neoplasm of breast: Secondary | ICD-10-CM | POA: Insufficient documentation

## 2024-03-31 ENCOUNTER — Encounter: Payer: Self-pay | Admitting: Emergency Medicine

## 2024-03-31 ENCOUNTER — Ambulatory Visit (INDEPENDENT_AMBULATORY_CARE_PROVIDER_SITE_OTHER)

## 2024-03-31 ENCOUNTER — Ambulatory Visit
Admission: EM | Admit: 2024-03-31 | Discharge: 2024-03-31 | Disposition: A | Discharge status: Home / Self Care | Attending: Emergency Medicine | Admitting: Emergency Medicine

## 2024-03-31 DIAGNOSIS — R051 Acute cough: Secondary | ICD-10-CM | POA: Diagnosis not present

## 2024-03-31 DIAGNOSIS — U071 COVID-19: Secondary | ICD-10-CM | POA: Insufficient documentation

## 2024-03-31 DIAGNOSIS — R0981 Nasal congestion: Secondary | ICD-10-CM | POA: Insufficient documentation

## 2024-03-31 DIAGNOSIS — J029 Acute pharyngitis, unspecified: Secondary | ICD-10-CM | POA: Insufficient documentation

## 2024-03-31 LAB — POCT RAPID STREP A (OFFICE): Rapid Strep A Screen: NEGATIVE

## 2024-03-31 LAB — POC SOFIA SARS ANTIGEN FIA: SARS Coronavirus 2 Ag: POSITIVE — AB

## 2024-03-31 MED ORDER — BENZONATATE 100 MG PO CAPS: 200.0000 mg | ORAL_CAPSULE | Freq: Three times a day (TID) | ORAL | 0 refills | Status: AC

## 2024-03-31 MED ORDER — PROMETHAZINE-DM 6.25-15 MG/5ML PO SYRP: 5.0000 mL | ORAL_SOLUTION | Freq: Four times a day (QID) | ORAL | 0 refills | Status: AC | PRN

## 2024-03-31 MED ORDER — IPRATROPIUM BROMIDE 0.06 % NA SOLN: 2.0000 | Freq: Four times a day (QID) | NASAL | 12 refills | Status: AC

## 2024-03-31 NOTE — ED Provider Notes (Signed)
 [facility]    CSN: [id number] Arrival date & time: 03/31/24  9157      History   Chief Complaint Chief Complaint  Patient presents with   Sore Throat   Cough   Fever    HPI Monique Brock is a 58 y.o. female.   HPI  58 year old female with past medical history significant for acquired hypothyroidism, GERD, high cholesterol, obesity, and obstructive sleep apnea presents for evaluation of 4 days with the respiratory symptoms which include nasal congestion and sinus pressure for a bloody green nasal discharge, intermittent ear pressure, sore throat, and cough that is productive for cloudy sputum.  She also endorses shortness of breath but no wheezing.  She developed a fever last night with a Tmax of 102.2.  She denies any known sick contacts or recent travel out of state or country.  Past Medical History:  Diagnosis Date   Arthritis    BIL KNEES   Calculus of gallbladder with acute and chronic cholecystitis without obstruction    Complication of anesthesia    Family history of adverse reaction to anesthesia    PTS DAD IS HARD TO WAKE UP-PATERNAL GRANDMOTHER-VERY SENSITIVE TO ANESTHESIA   GERD (gastroesophageal reflux disease)    PONV (postoperative nausea and vomiting)    NORMALLY VOMITS ONCE AFTER ANESTHESIA AND IS FINE AFTER THAT    Patient Active Problem List   Diagnosis Date Noted   Herpes simplex 09/01/2023   OSA (obstructive sleep apnea) 05/12/2023   Hypercholesterolemia 04/21/2022   Acquired hypothyroidism 04/13/2021   Gastroesophageal reflux disease with esophagitis without hemorrhage 04/13/2021   Bilateral post-traumatic osteoarthritis of knee 08/13/2017   Obesity (BMI 30.0-34.9) 08/13/2017   Abnormal uterine bleeding (AUB) 06/03/2017   History of fracture 06/03/2017   Menorrhagia with irregular cycle 06/03/2017   Postoperative state 03/21/2017    Past Surgical History:  Procedure Laterality Date   ABDOMINAL HYSTERECTOMY     BREAST CYST  ASPIRATION Left 04/25/2009   U/S cyst asp- neg   CESAREAN SECTION     CHOLECYSTECTOMY N/A 06/27/2017   Procedure: LAPAROSCOPIC CHOLECYSTECTOMY;  Surgeon: Nicholaus Selinda Birmingham, MD;  Location: [facility];  Service: General;  Laterality: N/A;   COLONOSCOPY     COLONOSCOPY N/A 05/22/2020   Procedure: COLONOSCOPY;  Surgeon: Maryruth Ole DASEN, MD;  Location: [facility];  Service: Endoscopy;  Laterality: N/A;   ENDOMETRIAL ABLATION     ESOPHAGOGASTRODUODENOSCOPY     ESOPHAGOGASTRODUODENOSCOPY N/A 05/22/2020   Procedure: ESOPHAGOGASTRODUODENOSCOPY (EGD);  Surgeon: Maryruth Ole DASEN, MD;  Location: [facility];  Service: Endoscopy;  Laterality: N/A;   EXCISION OF ENDOMETRIOMA  03/21/2017   Procedure: EXCISION OF ENDOMETRIOSIS IMPLANTS;  Surgeon: Lovetta Debby PARAS, MD;  Location: [facility];  Service: Gynecology;;   HEMORRHOID SURGERY N/A 12/08/2014   Procedure: HEMORRHOIDECTOMY;  Surgeon: Larinda Unknown Sharps, MD;  Location: [facility];  Service: General;  Laterality: N/A;   LAPAROSCOPIC BILATERAL SALPINGECTOMY Bilateral 03/21/2017   Procedure: LAPAROSCOPIC BILATERAL SALPINGECTOMY;  Surgeon: Lovetta Debby PARAS, MD;  Location: [facility];  Service: Gynecology;  Laterality: Bilateral;   LAPAROSCOPIC SUPRACERVICAL HYSTERECTOMY N/A 03/21/2017   Procedure: LAPAROSCOPIC SUPRACERVICAL HYSTERECTOMY;  Surgeon: Schermerhorn, Debby PARAS, MD;  Location: [facility];  Service: Gynecology;  Laterality: N/A;   ORIF PATELLA     PATELLAR TENDON REPAIR     TUBAL LIGATION      OB History   No obstetric history on file.      Home Medications    Prior to Admission medications  Medication Sig Start Date End Date Taking? Authorizing Provider  albuterol (VENTOLIN HFA) 108 (90 Base) MCG/ACT inhaler Inhale 2 inhalations into the lungs every 4 (four) hours as needed for Wheezing or Shortness of Breath 09/01/23 08/31/24 Yes [provider]  benzonatate (TESSALON) 100 MG capsule Take 2 capsules (200 mg total) by  mouth every 8 (eight) hours. 03/31/24  Yes Bernardino Ditch, NP  estradiol (VIVELLE-DOT) 0.1 MG/24HR patch SMARTSIG:1 Patch(s) Topical Twice a Week 02/23/24  Yes [provider]  famotidine (PEPCID) 20 MG tablet Take 20 mg by mouth 2 (two) times daily. 05/12/23 05/11/24 Yes [provider]  ipratropium (ATROVENT) 0.06 % nasal spray Place 2 sprays into both nostrils 4 (four) times daily. 03/31/24  Yes Bernardino Ditch, NP  promethazine -dextromethorphan (PROMETHAZINE -DM) 6.25-15 MG/5ML syrup Take 5 mLs by mouth 4 (four) times daily as needed. 03/31/24  Yes Bernardino Ditch, NP  levothyroxine (SYNTHROID) 100 MCG tablet Take 100 mcg by mouth daily. 05/25/20   [provider]  Multiple Vitamin (MULTIVITAMIN WITH MINERALS) TABS tablet Take 1 tablet by mouth daily.    [provider]  pantoprazole (PROTONIX) 40 MG tablet Take 40 mg by mouth 2 (two) times daily.    [provider]  omeprazole (PRILOSEC) 20 MG capsule Take 20 mg by mouth every morning.   06/15/20  [provider]    Family History Family History  Problem Relation Age of Onset   Hypertension Mother    Hyperlipidemia Mother    Hypertension Father    Hyperlipidemia Father    Bone cancer Maternal Aunt    Stomach cancer Maternal Grandmother    Leukemia Paternal Grandfather    Breast cancer Neg Hx     Social History Social History   Tobacco Use   Smoking status: Never   Smokeless tobacco: Never  Vaping Use   Vaping status: Never Used  Substance Use Topics   Alcohol use: No   Drug use: No     Allergies   Amoxicillin   Review of Systems Review of Systems  Constitutional:  Positive for fever.  HENT:  Positive for congestion, ear pain, rhinorrhea, sinus pressure and sore throat.   Respiratory:  Positive for cough and shortness of breath. Negative for wheezing.      Physical Exam Triage Vital Signs ED Triage Vitals  Encounter Vitals Group     BP      Girls Systolic BP  Percentile      Girls Diastolic BP Percentile      Boys Systolic BP Percentile      Boys Diastolic BP Percentile      Pulse      Resp      Temp      Temp src      SpO2      Weight      Height      Head Circumference      Peak Flow      Pain Score      Pain Loc      Pain Education      Exclude from Growth Chart    No data found.  Updated Vital Signs BP 124/79 (BP Location: Right Arm)   Pulse 98   Temp 99.1 F (37.3 C) (Oral)   Resp 18   Wt 177 lb 9.6 oz (80.6 kg)   LMP 03/13/2017   SpO2 94%   BMI 31.46 kg/m   Visual Acuity Right Eye Distance:   Left Eye Distance:   Bilateral  Distance:    Right Eye Near:   Left Eye Near:    Bilateral Near:     Physical Exam Vitals and nursing note reviewed.  Constitutional:      Appearance: Normal appearance. She is not ill-appearing.  HENT:     Head: Normocephalic and atraumatic.     Right Ear: Tympanic membrane, ear canal and external ear normal. There is no impacted cerumen.     Left Ear: Tympanic membrane, ear canal and external ear normal. There is no impacted cerumen.     Nose: Congestion and rhinorrhea present.     Comments: Nasal mucosa is edematous and erythematous with bloody discharge in both naris.    Mouth/Throat:     Mouth: Mucous membranes are moist.     Pharynx: Oropharynx is clear. Posterior oropharyngeal erythema present. No oropharyngeal exudate.     Comments: Mild erythema to the posterior oropharynx. Cardiovascular:     Rate and Rhythm: Normal rate and regular rhythm.     Pulses: Normal pulses.     Heart sounds: Normal heart sounds. No murmur heard.    No friction rub. No gallop.  Pulmonary:     Effort: Pulmonary effort is normal.     Breath sounds: Rhonchi present. No wheezing or rales.     Comments: Coarse lung sounds throughout the right lung fields and in the left base. Musculoskeletal:     Cervical back: Normal range of motion and neck supple. No tenderness.  Lymphadenopathy:     Cervical: No  cervical adenopathy.  Skin:    General: Skin is warm and dry.     Capillary Refill: Capillary refill takes less than 2 seconds.     Findings: No erythema or rash.  Neurological:     General: No focal deficit present.     Mental Status: She is alert and oriented to person, place, and time.      UC Treatments / Results  Labs (all labs ordered are listed, but only abnormal results are displayed) Labs Reviewed  POC SOFIA SARS ANTIGEN FIA - Abnormal; Notable for the following components:      Result Value   SARS Coronavirus 2 Ag Positive (*)    All other components within normal limits  POCT RAPID STREP A (OFFICE) - Normal  CULTURE, GROUP A STREP [facility])    EKG   Radiology No results found.  Procedures Procedures (including critical care time)  Medications Ordered in UC Medications - No data to display  Initial Impression / Assessment and Plan / UC Course  I have reviewed the triage vital signs and the nursing notes.  Pertinent labs & imaging results that were available during my care of the patient were reviewed by me and considered in my medical decision making (see chart for details).   Patient is a nontoxic-appearing 58 year old female presenting for evaluation of 4 days worth of respiratory symptoms with the acute onset of fever last night as outlined in HPI above.  She is endorsing shortness of breath though she is able to speak in full sentences without dyspnea or tachypnea.  Respiratory rate at triage was 18 with a 94% room air oxygen saturation.  She is currently has an elevated temp of 99.1.  She does have coarse lung sounds throughout her right lung fields and in the left base.  She also is edematous and erythematous nasal mucosa with bloody discharge in both nares.  Differential diagnose include COVID, influenza, strep pharyngitis, pneumonia, sinusitis, viral respiratory illness.  Due  to the fact that she is on day 4 of symptoms I would not test her for influenza at  this time but I will order a COVID antigen test, rapid strep, and chest x-ray to evaluate for any acute cardiopulmonary pathology.  Rapid strep is negative.  I will send swab for culture.  COVID antigen test is positive.  Chest x-ray independently reviewed and evaluated by me.  Impression: Lung fields are well aerated without evidence of infiltrate or effusion.  Cardiomediastinal silhouette appears normal.  Radiology overread is pending.  I discussed antiviral therapy with the patient and she is declining at this time.  I will discharge her home with a diagnosis of COVID-19.  She has an albuterol inhaler at home and she can continue to use that every 4-6 hours as needed for shortness breath or wheezing.  I will also prescribe Atrovent nasal spray for her nasal congestion and Tessalon Perles and Promethazine  DM cough syrup for cough and congestion.  Return to ER precautions reviewed.  Work note provided.   Final Clinical Impressions(s) / UC Diagnoses   Final diagnoses:  Acute pharyngitis, unspecified etiology  Nasal congestion  Acute cough  COVID-19     Discharge Instructions      CDC guidelines state that you must wear a mask for the first 5 days of symptoms when you are around other people.  After 5 days you no longer need to mask as you are no longer considered infectious.  There is no longer need to quarantine unless you have a fever.  If you do have a fever then you need to quarantine until you have been fever free for 24 hours without taking Tylenol  and/or ibuprofen.  Use over-the-counter Tylenol  and/or ibuprofen according to the package instructions as needed for fever and pain.  Use the Atrovent nasal spray, 2 squirts up each nostril every 6 hours, as needed for nasal congestion and runny nose.  Use the Tessalon Perles every 8 hours during the day as needed for cough.  Take them with a small sip of water.  You may experience numbness to the base of your tongue or metallic taste  in her mouth, this is normal.  Use the Promethazine  DM cough syrup at bedtime as needed for cough and congestion.  Be mindful this medication will make you sleepy.  Use your albuterol inhaler as needed for any shortness breath or wheezing.  You may take 1 to 2 puffs every 4-6 hours as needed.  If you develop any worsening respiratory symptoms such as shortness of breath, shortness of breath at rest, feel as though you cannot catch your breath, you are unable to speak in full sentences, or, as a late sign, your lips begin turning blue you need to call 911 and go to the ER for evaluation.      ED Prescriptions     Medication Sig Dispense Auth. Provider   benzonatate (TESSALON) 100 MG capsule Take 2 capsules (200 mg total) by mouth every 8 (eight) hours. 21 capsule Bernardino Ditch, NP   ipratropium (ATROVENT) 0.06 % nasal spray Place 2 sprays into both nostrils 4 (four) times daily. 15 mL Bernardino Ditch, NP   promethazine -dextromethorphan (PROMETHAZINE -DM) 6.25-15 MG/5ML syrup Take 5 mLs by mouth 4 (four) times daily as needed. 118 mL Bernardino Ditch, NP      PDMP not reviewed this encounter.   Bernardino Ditch, NP 03/31/24 1023

## 2024-03-31 NOTE — Discharge Instructions (Signed)
 CDC guidelines state that you must wear a mask for the first 5 days of symptoms when you are around other people.  After 5 days you no longer need to mask as you are no longer considered infectious.  There is no longer need to quarantine unless you have a fever.  If you do have a fever then you need to quarantine until you have been fever free for 24 hours without taking Tylenol  and/or ibuprofen.  Use over-the-counter Tylenol  and/or ibuprofen according to the package instructions as needed for fever and pain.  Use the Atrovent  nasal spray, 2 squirts up each nostril every 6 hours, as needed for nasal congestion and runny nose.  Use the Tessalon  Perles every 8 hours during the day as needed for cough.  Take them with a small sip of water.  You may experience numbness to the base of your tongue or metallic taste in her mouth, this is normal.  Use the Promethazine  DM cough syrup at bedtime as needed for cough and congestion.  Be mindful this medication will make you sleepy.  Use your albuterol inhaler as needed for any shortness breath or wheezing.  You may take 1 to 2 puffs every 4-6 hours as needed.  If you develop any worsening respiratory symptoms such as shortness of breath, shortness of breath at rest, feel as though you cannot catch your breath, you are unable to speak in full sentences, or, as a late sign, your lips begin turning blue you need to call 911 and go to the ER for evaluation.

## 2024-03-31 NOTE — ED Triage Notes (Signed)
 Pt c/o sore throat,runny nose,cough & sinus pressure x4 days. Had fever yesterday. Tmax 102.2. Has tried OTC meds w/o relief.

## 2024-04-02 ENCOUNTER — Ambulatory Visit: Payer: Self-pay

## 2024-04-02 LAB — CULTURE, GROUP A STREP (THRC)
